# Patient Record
Sex: Female | Born: 1955
Health system: Southern US, Community
[De-identification: ages and names within clinical notes are randomized; demographics above are authoritative.]

## PROBLEM LIST (undated history)

## (undated) DIAGNOSIS — M1990 Unspecified osteoarthritis, unspecified site: Secondary | ICD-10-CM

## (undated) DIAGNOSIS — K219 Gastro-esophageal reflux disease without esophagitis: Secondary | ICD-10-CM

## (undated) DIAGNOSIS — F32A Depression, unspecified: Secondary | ICD-10-CM

## (undated) DIAGNOSIS — M779 Enthesopathy, unspecified: Secondary | ICD-10-CM

## (undated) DIAGNOSIS — E538 Deficiency of other specified B group vitamins: Secondary | ICD-10-CM

## (undated) DIAGNOSIS — F329 Major depressive disorder, single episode, unspecified: Secondary | ICD-10-CM

## (undated) DIAGNOSIS — E7800 Pure hypercholesterolemia, unspecified: Secondary | ICD-10-CM

## (undated) HISTORY — PX: CHOLECYSTECTOMY: SHX55

## (undated) HISTORY — DX: Enthesopathy, unspecified: M77.9

## (undated) HISTORY — PX: DILATION AND CURETTAGE OF UTERUS: SHX78

## (undated) HISTORY — DX: Deficiency of other specified B group vitamins: E53.8

## (undated) HISTORY — DX: Unspecified osteoarthritis, unspecified site: M19.90

## (undated) HISTORY — PX: ROTATOR CUFF REPAIR: SHX139

---

## 1998-01-04 ENCOUNTER — Other Ambulatory Visit: Admission: RE | Admit: 1998-01-04 | Discharge: 1998-01-04 | Payer: Self-pay | Admitting: *Deleted

## 1998-04-15 ENCOUNTER — Emergency Department (HOSPITAL_COMMUNITY): Admission: EM | Admit: 1998-04-15 | Discharge: 1998-04-15 | Payer: Self-pay | Admitting: Emergency Medicine

## 1998-08-05 ENCOUNTER — Other Ambulatory Visit: Admission: RE | Admit: 1998-08-05 | Discharge: 1998-08-05 | Payer: Self-pay | Admitting: *Deleted

## 1999-01-04 ENCOUNTER — Other Ambulatory Visit: Admission: RE | Admit: 1999-01-04 | Discharge: 1999-01-04 | Payer: Self-pay | Admitting: *Deleted

## 1999-01-27 ENCOUNTER — Other Ambulatory Visit: Admission: RE | Admit: 1999-01-27 | Discharge: 1999-01-27 | Payer: Self-pay | Admitting: *Deleted

## 1999-01-27 ENCOUNTER — Encounter (INDEPENDENT_AMBULATORY_CARE_PROVIDER_SITE_OTHER): Payer: Self-pay | Admitting: Specialist

## 2000-01-09 ENCOUNTER — Other Ambulatory Visit: Admission: RE | Admit: 2000-01-09 | Discharge: 2000-01-09 | Payer: Self-pay | Admitting: *Deleted

## 2000-03-18 ENCOUNTER — Other Ambulatory Visit: Admission: RE | Admit: 2000-03-18 | Discharge: 2000-03-18 | Payer: Self-pay | Admitting: *Deleted

## 2001-02-05 ENCOUNTER — Encounter: Admission: RE | Admit: 2001-02-05 | Discharge: 2001-04-09 | Payer: Self-pay | Admitting: Specialist

## 2001-04-07 ENCOUNTER — Other Ambulatory Visit: Admission: RE | Admit: 2001-04-07 | Discharge: 2001-04-07 | Payer: Self-pay | Admitting: *Deleted

## 2001-08-25 ENCOUNTER — Encounter: Admission: RE | Admit: 2001-08-25 | Discharge: 2001-11-23 | Payer: Self-pay | Admitting: *Deleted

## 2001-08-27 ENCOUNTER — Encounter: Payer: Self-pay | Admitting: Specialist

## 2001-08-27 ENCOUNTER — Ambulatory Visit (HOSPITAL_COMMUNITY)
Admission: RE | Admit: 2001-08-27 | Discharge: 2001-08-27 | Payer: Self-pay | Admitting: Physical Medicine & Rehabilitation

## 2001-11-24 ENCOUNTER — Encounter: Admission: RE | Admit: 2001-11-24 | Discharge: 2002-01-19 | Payer: Self-pay | Admitting: Specialist

## 2002-09-21 ENCOUNTER — Encounter: Admission: RE | Admit: 2002-09-21 | Discharge: 2002-10-20 | Payer: Self-pay | Admitting: *Deleted

## 2003-07-10 ENCOUNTER — Emergency Department (HOSPITAL_COMMUNITY): Admission: EM | Admit: 2003-07-10 | Discharge: 2003-07-10 | Payer: Self-pay | Admitting: Emergency Medicine

## 2003-07-26 ENCOUNTER — Ambulatory Visit (HOSPITAL_COMMUNITY): Admission: RE | Admit: 2003-07-26 | Discharge: 2003-07-27 | Payer: Self-pay | Admitting: General Surgery

## 2003-07-26 ENCOUNTER — Encounter (INDEPENDENT_AMBULATORY_CARE_PROVIDER_SITE_OTHER): Payer: Self-pay | Admitting: *Deleted

## 2005-05-07 ENCOUNTER — Other Ambulatory Visit: Admission: RE | Admit: 2005-05-07 | Discharge: 2005-05-07 | Payer: Self-pay | Admitting: Family Medicine

## 2007-01-10 ENCOUNTER — Encounter: Admission: RE | Admit: 2007-01-10 | Discharge: 2007-01-10 | Payer: Self-pay | Admitting: Internal Medicine

## 2008-09-22 ENCOUNTER — Encounter: Admission: RE | Admit: 2008-09-22 | Discharge: 2008-09-22 | Payer: Self-pay | Admitting: Gastroenterology

## 2010-07-09 ENCOUNTER — Encounter: Payer: Self-pay | Admitting: Gastroenterology

## 2010-10-17 ENCOUNTER — Other Ambulatory Visit: Payer: Self-pay | Admitting: Obstetrics and Gynecology

## 2010-11-03 NOTE — H&P (Signed)
Natalie Chen NO.:  [id number]   MEDICAL RECORD NO.:  [id number]                   PATIENT TYPE:  EMS   LOCATION:  MAJO                                 FACILITY:  [facility]   PHYSICIAN:  Adolph Pollack, M.D.            DATE OF BIRTH:  June 12, 1956   DATE OF ADMISSION:  07/10/2003  DATE OF DISCHARGE:                                HISTORY & PHYSICAL   HISTORY AND PHYSICAL/EMERGENCY DEPARTMENT VISIT:   REASON FOR VISIT:  Right upper quadrant pain.   HISTORY OF PRESENT ILLNESS:  Natalie Chen is a 55 year old female in her  normal state of health.  She had a cheeseburger and [place] fries last night  and at 1 o'clock in the morning awoke with severe pressure-type epigastric  pain radiating around to the back.  No nausea or vomiting, no fever or  chills.  She lives close to [facility] so she went there  and was evaluated.  While she was there she had a cardiac workup, and her  cardiac enzymes were negative, and an EKG was negative for acute ischemic  changes.  Amylase was normal.  White count was slightly elevated at 11,800.  They went ahead and performed an abdominal ultrasound on her.  What it  demonstrated was a contracted gallbladder with multiple stones, question  cholecystitis.  She felt better and was discharged from there.  On the ride  home she got a little bit of return of the pain, took some Darvocet and  decided since her daughter was over at [facility] in rehabilitation to  come over here to be evaluated.  She was seen by Dr. Molly Maduro __________ , and  we were asked to see her.  Upon my arrival she was completely pain-free, has  no nausea, feels much better.  She has not received any pain medication.  She has not had anything like this before.   PAST MEDICAL HISTORY:  1. Depression.  2. Ovarian cyst.  3. Shoulder pain.   PREVIOUS OPERATIONS:  1. Cesarean section x3.  2. Laparoscopic ovarian cystectomy.   MEDICAL ALLERGIES:  None.   MEDICATIONS:  Effexor.  Also takes some herbal medications.   SOCIAL HISTORY:  She smokes about a pack of cigarettes a day.  She works for  [employer].  Occasional alcohol use.   REVIEW OF SYSTEMS:  CARDIOVASCULAR:  No known hypertension or heart disease.  PULMONARY:  She has had bronchitis in the past but denies COPD, pneumonia,  or asthma.  GASTROINTESTINAL:  No peptic ulcer disease, hepatitis,  diverticulitis.  ENDOCRINE:  No diabetes, hypercholesterolemia.  NEUROLOGIC:  No strokes or seizures.  HEMATOLOGIC:  No bleeding disorders or blood clots.   PHYSICAL EXAM:  GENERAL:  Mildly obese female in no acute distress.  She is  very pleasant, cooperative.  Sitting up in the bed.  VITAL SIGNS:  She is afebrile with a blood pressure on arrival  of 124/46 and  a pulse of 90.  HEENT:  Eyes, extraocular motions intact.  No icterus.  SKIN:  No jaundice.  LUNGS:  Breath sounds equal and clear.  Respirations not labored.  CARDIOVASCULAR:  Regular rate and rhythm.  No murmur heard.  No lower  extremity edema.  No JVD.  ABDOMEN:  Soft, nontender, nondistended.  Specifically, there is no right  upper quadrant tenderness or guarding.  No palpable masses or organomegaly.  She has a well-healed small subumbilical scar and a lower midline scar  present.  MUSCULOSKELETAL:  No CVA tenderness.  No calf swelling.   LABORATORY DATA:  From [facility]:  Liver function tests are all within  normal limits.  White cell count 11,800 at [facility].  [facility]  records reviewed.   IMPRESSION:  Acute biliary colic.  No signs of acute cholecystitis at this  time.  She seems to have gotten over it and feels much better now.   PLAN:  I discussed with her elective laparoscopic cholecystectomy some time  in the near future.  I went over the procedure and the risks including but  not limited to bleeding, infection, common bile duct injury, hepatic injury,   intestinal injury, risks of anesthesia, and postoperative diarrhea.  She  seems to understand all this and would be agreeable to proceeding some time  in the future.  I also discussed with her a strict low-fat/bland diet.  I  told her we would call her some time next week and try to arrange an  elective date for her surgery.  I did tell her if the pain returned and she  has fever and nausea that she should give [place] a call sooner.                                                Adolph Pollack, M.D.    Kari Baars  D:  07/10/2003  T:  07/10/2003  Job:  [id number]

## 2010-11-03 NOTE — Op Note (Signed)
NAME:  Natalie Chen, Natalie Chen                     ACCOUNT NO.:  [id number]   MEDICAL RECORD NO.:  [id number]                   PATIENT TYPE:  OIB   LOCATION:  6706                                 FACILITY:  [facility]   PHYSICIAN:  Adolph Pollack, M.D.            DATE OF BIRTH:  1956-05-24   DATE OF PROCEDURE:  07/26/2003  DATE OF DISCHARGE:                                 OPERATIVE REPORT   PREOPERATIVE DIAGNOSES:  Intermittent cholelithiasis.   POSTOPERATIVE DIAGNOSES:  Intermittent cholelithiasis.   PROCEDURE:  Laparoscopic cholecystectomy with intraoperative cholangiogram.   SURGEON:  Adolph Pollack, M.D.   ANESTHESIA:  General.   INDICATIONS FOR PROCEDURE:  Natalie Chen is a 55 year old female who had an  attack of biliary colic after eating a cheeseburger and [place] fries.  Ultrasound demonstrated cholelithiasis.  Amylase was normal.  She now  presents for elective cholecystectomy. The procedure and the risks were  discussed with her preoperatively.   TECHNIQUE:  She was seen in the holding area and then brought to the  operating room, placed supine on the operating table, general anesthetic was  administered. Her abdominal wall was sterilely prepped and draped.  Local  anesthetic was infiltrated in the subumbilical region and a previous small  subumbilical incision was reincised sharply through the skin and  subcutaneous tissue until the midline fascia was identified. A small  incision was made in the midline fascia and the peritoneal cavity was  entered bluntly and under direct vision.  A pursestring suture of #0 Vicryl  was placed around the fascial edges.  A Hasson trocar was introduced into  the peritoneal cavity and a pneumoperitoneum was created by insufflation of  CO2 gas.  Next, the laparoscope was introduced.  She was noted to have a  small amount of free fluid in the right pelvic area slightly blood tinged.  She was then placed in the reverse Trendelenburg  position with the right  side tilted slightly up.  Under direct vision, an 11 mm trocar was placed  through an epigastric incision into the peritoneal cavity and then two 5 mm  trocars were placed into the peritoneal cavity through right mid abdominal  incisions.  The fundus of the gallbladder was slightly discolored and pale  red, grasped and retracted toward the right shoulder. The infundibulum was  grasped and mobilized using select cautery and blunt dissection.  I then  isolated the cystic duct and created a window around it.  I placed a clip  above the cystid cut gallbladder junction.  I made an incision into the cyst  at the cystic duct gallbladder junction and inserted the cholangiocatheter  into the cystic duct. A cholangiogram was performed.   Under real-time fluoroscopy, dilute contrast material was injected into the  cystic duct which was of moderate length. The common bile duct and right and  left hepatic ducts filled promptly and the common bile duct drained contrast  promptly to  the duodenum. There is no obvious evidence of obstruction but  final report is pending the radiologist's interpretation.   The cholangiocatheter was removed, the cystic duct was clipped three times  staying inside and then divided.  The anterior and posterior branch of the  cystic artery were identified, clipped, and divided.  The gallbladder was  dissected free from the liver bed using electrocautery and placed in an  Endopouch bag. Following this, the superior hepatic area was copiously  irrigated with saline solution. The solution was evacuated. There was no  bleeding or bile leak noted. The gallbladder was then removed through the  subumbilical port and the subumbilical fascial incision was closed with by  tightening up and tying down the pursestring suture under laparoscopic  vision. The remaining trocars were removed and a pneumoperitoneum was  released.   The skin incisions were then closed  with 4-0 Monocryl subcuticular stitches.  Steri-Strips and sterile dressings were applied. She tolerated the procedure  well without any apparent complications.  She subsequently was taken to the  recovery room in satisfactory condition.                                               Adolph Pollack, M.D.    Kari Baars  D:  07/26/2003  T:  07/27/2003  Job:  [id number]

## 2012-01-09 ENCOUNTER — Other Ambulatory Visit: Payer: Self-pay | Admitting: Obstetrics and Gynecology

## 2012-01-09 DIAGNOSIS — N644 Mastodynia: Secondary | ICD-10-CM

## 2012-01-14 ENCOUNTER — Ambulatory Visit
Admission: RE | Admit: 2012-01-14 | Discharge: 2012-01-14 | Disposition: A | Discharge status: Home / Self Care | Payer: BC Managed Care – PPO | Source: Ambulatory Visit | Attending: Obstetrics and Gynecology | Admitting: Obstetrics and Gynecology

## 2012-01-14 DIAGNOSIS — N644 Mastodynia: Secondary | ICD-10-CM

## 2012-03-25 ENCOUNTER — Emergency Department (HOSPITAL_BASED_OUTPATIENT_CLINIC_OR_DEPARTMENT_OTHER)
Admission: EM | Admit: 2012-03-25 | Discharge: 2012-03-25 | Disposition: A | Discharge status: Home / Self Care | Payer: BC Managed Care – PPO | Attending: Emergency Medicine | Admitting: Emergency Medicine

## 2012-03-25 ENCOUNTER — Encounter (HOSPITAL_BASED_OUTPATIENT_CLINIC_OR_DEPARTMENT_OTHER): Payer: Self-pay | Admitting: Emergency Medicine

## 2012-03-25 ENCOUNTER — Emergency Department (HOSPITAL_BASED_OUTPATIENT_CLINIC_OR_DEPARTMENT_OTHER): Payer: BC Managed Care – PPO

## 2012-03-25 DIAGNOSIS — R109 Unspecified abdominal pain: Secondary | ICD-10-CM | POA: Insufficient documentation

## 2012-03-25 DIAGNOSIS — R112 Nausea with vomiting, unspecified: Secondary | ICD-10-CM | POA: Insufficient documentation

## 2012-03-25 DIAGNOSIS — F17200 Nicotine dependence, unspecified, uncomplicated: Secondary | ICD-10-CM | POA: Insufficient documentation

## 2012-03-25 DIAGNOSIS — R10816 Epigastric abdominal tenderness: Secondary | ICD-10-CM | POA: Insufficient documentation

## 2012-03-25 DIAGNOSIS — R197 Diarrhea, unspecified: Secondary | ICD-10-CM | POA: Insufficient documentation

## 2012-03-25 DIAGNOSIS — Z9089 Acquired absence of other organs: Secondary | ICD-10-CM | POA: Insufficient documentation

## 2012-03-25 HISTORY — DX: Major depressive disorder, single episode, unspecified: F32.9

## 2012-03-25 HISTORY — DX: Depression, unspecified: F32.A

## 2012-03-25 HISTORY — DX: Pure hypercholesterolemia, unspecified: E78.00

## 2012-03-25 HISTORY — DX: Gastro-esophageal reflux disease without esophagitis: K21.9

## 2012-03-25 LAB — URINALYSIS, ROUTINE W REFLEX MICROSCOPIC
Leukocytes, UA: NEGATIVE
Nitrite: NEGATIVE
Specific Gravity, Urine: 1.028 (ref 1.005–1.030)
Urobilinogen, UA: 1 mg/dL (ref 0.0–1.0)
pH: 5.5 (ref 5.0–8.0)

## 2012-03-25 LAB — CBC WITH DIFFERENTIAL/PLATELET
Basophils Absolute: 0 10*3/uL (ref 0.0–0.1)
HCT: 28.6 % — ABNORMAL LOW (ref 36.0–46.0)
Hemoglobin: 13.1 g/dL (ref 12.0–15.0)
Hemoglobin: 9.5 g/dL — ABNORMAL LOW (ref 12.0–15.0)
Lymphocytes Relative: 15 % (ref 12–46)
Lymphocytes Relative: 17 % (ref 12–46)
Lymphs Abs: 1.1 10*3/uL (ref 0.7–4.0)
MCH: 30 pg (ref 26.0–34.0)
Monocytes Absolute: 0.4 10*3/uL (ref 0.1–1.0)
Monocytes Relative: 10 % (ref 3–12)
Neutro Abs: 3.7 10*3/uL (ref 1.7–7.7)
Neutro Abs: 5.4 10*3/uL (ref 1.7–7.7)
Neutrophils Relative %: 74 % (ref 43–77)
Neutrophils Relative %: 75 % (ref 43–77)
Platelets: 197 10*3/uL (ref 150–400)
RBC: 4.36 MIL/uL (ref 3.87–5.11)
RDW: 13.9 % (ref 11.5–15.5)
WBC: 5 10*3/uL (ref 4.0–10.5)
WBC: 7.3 10*3/uL (ref 4.0–10.5)

## 2012-03-25 LAB — COMPREHENSIVE METABOLIC PANEL
ALT: 114 U/L — ABNORMAL HIGH (ref 0–35)
Alkaline Phosphatase: 76 U/L (ref 39–117)
BUN: 12 mg/dL (ref 6–23)
Chloride: 104 mEq/L (ref 96–112)
GFR calc Af Amer: >90 mL/min (ref >90)
Glucose, Bld: 152 mg/dL — ABNORMAL HIGH (ref 70–99)
Potassium: 3.1 mEq/L — ABNORMAL LOW (ref 3.5–5.1)
Sodium: 138 mEq/L (ref 135–145)
Total Bilirubin: 0.6 mg/dL (ref 0.3–1.2)
Total Protein: 6.1 g/dL (ref 6.0–8.3)

## 2012-03-25 LAB — URINE MICROSCOPIC-ADD ON

## 2012-03-25 LAB — LIPASE, BLOOD: Lipase: 13 U/L (ref 11–59)

## 2012-03-25 MED ORDER — GI COCKTAIL ~~LOC~~
30.0000 mL | Freq: Once | ORAL | Status: AC
  Administered 2012-03-25: 30 mL via ORAL
  Filled 2012-03-25: qty 30

## 2012-03-25 MED ORDER — HYDROMORPHONE HCL PF 1 MG/ML IJ SOLN
1.0000 mg | Freq: Once | INTRAMUSCULAR | Status: AC
  Administered 2012-03-25: 1 mg via INTRAVENOUS
  Filled 2012-03-25: qty 1

## 2012-03-25 MED ORDER — IOHEXOL 300 MG/ML  SOLN
100.0000 mL | Freq: Once | INTRAMUSCULAR | Status: AC | PRN
  Administered 2012-03-25: 100 mL via INTRAVENOUS

## 2012-03-25 MED ORDER — SODIUM CHLORIDE 0.9 % IV SOLN
INTRAVENOUS | Status: DC
  Administered 2012-03-25: 01:00:00 via INTRAVENOUS

## 2012-03-25 MED ORDER — IOHEXOL 300 MG/ML  SOLN
20.0000 mL | Freq: Once | INTRAMUSCULAR | Status: AC | PRN
  Administered 2012-03-25: 20 mL via ORAL

## 2012-03-25 MED ORDER — ONDANSETRON HCL 4 MG/2ML IJ SOLN
4.0000 mg | Freq: Once | INTRAMUSCULAR | Status: AC
  Administered 2012-03-25: 4 mg via INTRAVENOUS
  Filled 2012-03-25: qty 2

## 2012-03-25 MED ORDER — ONDANSETRON 8 MG PO TBDP: 8.0000 mg | ORAL_TABLET | Freq: Three times a day (TID) | ORAL | Status: DC | PRN

## 2012-03-25 MED ORDER — OXYCODONE-ACETAMINOPHEN 5-325 MG PO TABS: 1.0000 | ORAL_TABLET | Freq: Four times a day (QID) | ORAL | Status: DC | PRN

## 2012-03-25 NOTE — ED Provider Notes (Addendum)
History   This chart was scribed for Natalie Seamen, MD by Sofie Rower. The patient was seen in room MH07/MH07 and the patient's care was started at 12:25AM.    CSN: [id number]  Arrival date & time 03/25/12  0016   None     Chief Complaint  Patient presents with  . Abdominal Pain    (Consider location/radiation/quality/duration/timing/severity/associated sxs/prior treatment) Patient is a 56 y.o. female presenting with abdominal pain. The history is provided by the patient. No language interpreter was used.  Abdominal Pain The primary symptoms of the illness include abdominal pain, nausea, vomiting and diarrhea. The primary symptoms of the illness do not include fever or dysuria. The current episode started more than 2 days ago. The onset of the illness was sudden. The problem has been gradually worsening.  The abdominal pain began more than 2 days ago. The pain came on suddenly. The abdominal pain has been gradually worsening since its onset. The abdominal pain is located in the epigastric region. The abdominal pain radiates to the back. The severity of the abdominal pain is 9/10. The abdominal pain is relieved by nothing. The abdominal pain is exacerbated by movement.  Nausea began 6 to 7 days ago.  The vomiting began today. Vomiting occurred once. The emesis contains stomach contents.  The diarrhea began today. The diarrhea is watery. The diarrhea occurs once per day.  Symptoms associated with the illness do not include chills.    Natalie Chen is a 56 y.o. female , brought by EMS, who presents to the Emergency Department complaining of  sudden, progressively worsening, abdominal pain located epigastrically, radiating towards the back, onset one week ago, with associated symptoms of nausea, vomiting, and diarrhea. The pt reports she has been experiencing abdominal pain for about a week, however, the symptoms have become severe as of yesterday evening. The pt rates the abdominal pain at  a 9/10 at present. The pt has not been given any medications by EMS, prior to arrival. Modifying factors include certain movements and positions which intensify the abdominal pain.  The pt denies fever or chills associated with the abdominal pain.   The pt is a current everyday smoker (0.5 packs/day), in addition to drinking alcohol.    Past Medical History  Diagnosis Date  . High cholesterol   . GERD (gastroesophageal reflux disease)   . Depression     Past Surgical History  Procedure Date  . Cholecystectomy   . Cesarean section   . Rotator cuff repair     No family history on file.  History  Substance Use Topics  . Smoking status: Current Every Day Smoker -- 0.5 packs/day  . Smokeless tobacco: Never Used  . Alcohol Use: Yes    OB History    Grav Para Term Preterm Abortions TAB SAB Ect Mult Living                  Review of Systems  Constitutional: Negative for fever and chills.  Gastrointestinal: Positive for nausea, vomiting, abdominal pain and diarrhea.  Genitourinary: Negative for dysuria.  All other systems reviewed and are negative.    Allergies  Vicodin and Ultram  Home Medications   Current Outpatient Rx  Name Route Sig Dispense Refill  . ASPIRIN 81 MG PO TABS Oral Take 81 mg by mouth daily.    Marland Kitchen NIACIN 500 MG PO TABS Oral Take 500 mg by mouth daily with breakfast.    . PANTOPRAZOLE SODIUM 40 MG PO TBEC  Oral Take 40 mg by mouth daily.    . VENLAFAXINE HCL ER 150 MG PO CP24 Oral Take 150 mg by mouth daily.      BP 113/54  Pulse 67  Temp 98.2 F (36.8 C) (Oral)  Resp 17  Ht 5\' 4"  (1.626 m)  Wt 200 lb (90.719 kg)  BMI 34.33 kg/m2  SpO2 97%  Physical Exam  Nursing note and vitals reviewed. Constitutional: She is oriented to person, place, and time. She appears well-developed and well-nourished.  HENT:  Head: Atraumatic.  Nose: Nose normal.  Eyes: Conjunctivae normal and EOM are normal. Pupils are equal, round, and reactive to light.    Neck: Normal range of motion. Neck supple.  Cardiovascular: Normal rate, regular rhythm and normal heart sounds.   Pulmonary/Chest: Effort normal and breath sounds normal. No respiratory distress.  Abdominal: Soft. There is tenderness.       Abdominal tenderness located epigastrically.   Musculoskeletal: Normal range of motion.  Neurological: She is alert and oriented to person, place, and time.  Skin: Skin is warm and dry.  Psychiatric: She has a normal mood and affect. Her behavior is normal.    ED Course  Procedures (including critical care time)  DIAGNOSTIC STUDIES: Oxygen Saturation is 97% on room air, normal by my interpretation.     12:28AM- Treatment plan discussed with patient. Pt agrees to treatment.     MDM   Nursing notes and vitals signs, including pulse oximetry, reviewed.  Summary of this visit's results, reviewed by myself:  Labs:  Results for orders placed during the hospital encounter of 03/25/12  CBC WITH DIFFERENTIAL      Component Value Range   WBC 5.0  4.0 - 10.5 K/uL   RBC 3.18 (*) 3.87 - 5.11 MIL/uL   Hemoglobin 9.5 (*) 12.0 - 15.0 g/dL   HCT 16.1 (*) 09.6 - 04.5 %   MCV 89.9  78.0 - 100.0 fL   MCH 29.9  26.0 - 34.0 pg   MCHC 33.2  30.0 - 36.0 g/dL   RDW 40.9  81.1 - 91.4 %   Platelets 148 (*) 150 - 400 K/uL   Neutrophils Relative 74  43 - 77 %   Neutro Abs 3.7  1.7 - 7.7 K/uL   Lymphocytes Relative 17  12 - 46 %   Lymphs Abs 0.9  0.7 - 4.0 K/uL   Monocytes Relative 7  3 - 12 %   Monocytes Absolute 0.4  0.1 - 1.0 K/uL   Eosinophils Relative 2  0 - 5 %   Eosinophils Absolute 0.1  0.0 - 0.7 K/uL   Basophils Relative 0  0 - 1 %   Basophils Absolute 0.0  0.0 - 0.1 K/uL  URINALYSIS, ROUTINE W REFLEX MICROSCOPIC      Component Value Range   Color, Urine AMBER (*) YELLOW   APPearance CLEAR  CLEAR   Specific Gravity, Urine 1.028  1.005 - 1.030   pH 5.5  5.0 - 8.0   Glucose, UA NEGATIVE  NEGATIVE mg/dL   Hgb urine dipstick TRACE (*) NEGATIVE    Bilirubin Urine SMALL (*) NEGATIVE   Ketones, ur 15 (*) NEGATIVE mg/dL   Protein, ur NEGATIVE  NEGATIVE mg/dL   Urobilinogen, UA 1.0  0.0 - 1.0 mg/dL   Nitrite NEGATIVE  NEGATIVE   Leukocytes, UA NEGATIVE  NEGATIVE  CBC WITH DIFFERENTIAL      Component Value Range   WBC 7.3  4.0 - 10.5 K/uL  RBC 4.36  3.87 - 5.11 MIL/uL   Hemoglobin 13.1  12.0 - 15.0 g/dL   HCT 40.9  81.1 - 91.4 %   MCV 88.8  78.0 - 100.0 fL   MCH 30.0  26.0 - 34.0 pg   MCHC 33.9  30.0 - 36.0 g/dL   RDW 78.2  95.6 - 21.3 %   Platelets 197  150 - 400 K/uL   Neutrophils Relative 75  43 - 77 %   Neutro Abs 5.4  1.7 - 7.7 K/uL   Lymphocytes Relative 15  12 - 46 %   Lymphs Abs 1.1  0.7 - 4.0 K/uL   Monocytes Relative 10  3 - 12 %   Monocytes Absolute 0.7  0.1 - 1.0 K/uL   Eosinophils Relative 1  0 - 5 %   Eosinophils Absolute 0.1  0.0 - 0.7 K/uL   Basophils Relative 0  0 - 1 %   Basophils Absolute 0.0  0.0 - 0.1 K/uL  COMPREHENSIVE METABOLIC PANEL      Component Value Range   Sodium 138  135 - 145 mEq/L   Potassium 3.1 (*) 3.5 - 5.1 mEq/L   Chloride 104  96 - 112 mEq/L   CO2 23  19 - 32 mEq/L   Glucose, Bld 152 (*) 70 - 99 mg/dL   BUN 12  6 - 23 mg/dL   Creatinine, Ser 0.86  0.50 - 1.10 mg/dL   Calcium 8.3 (*) 8.4 - 10.5 mg/dL   Total Protein 6.1  6.0 - 8.3 g/dL   Albumin 3.4 (*) 3.5 - 5.2 g/dL   AST 578 (*) 0 - 37 U/L   ALT 114 (*) 0 - 35 U/L   Alkaline Phosphatase 76  39 - 117 U/L   Total Bilirubin 0.6  0.3 - 1.2 mg/dL   GFR calc non Af Amer >90  >90 mL/min   GFR calc Af Amer >90  >90 mL/min  LIPASE, BLOOD      Component Value Range   Lipase 13  11 - 59 U/L  URINE MICROSCOPIC-ADD ON      Component Value Range   Squamous Epithelial / LPF FEW (*) RARE   WBC, UA 0-2  <3 WBC/hpf   RBC / HPF 0-2  <3 RBC/hpf   Casts GRANULAR CAST (*) NEGATIVE   Urine-Other MUCOUS PRESENT      Imaging Studies: Ct Abdomen Pelvis W Contrast  03/25/2012  *RADIOLOGY REPORT*  Clinical Data: 56 year old female with  epigastric pain and chills. History of cholecystectomy.  CT ABDOMEN AND PELVIS WITH CONTRAST  Technique:  Multidetector CT imaging of the abdomen and pelvis was performed following the standard protocol during bolus administration of intravenous contrast.  Contrast: OMNIPAQUE IOHEXOL 300 MG/ML  SOLN, 20mL OMNIPAQUE IOHEXOL 300 MG/ML  SOLN  Comparison: Upper GI series 09/22/2008.  Findings: Mild dependent atelectasis.  No pericardial or pleural effusion. No acute osseous abnormality identified.  Trace free fluid in the cul-de-sac.  Uterus and adnexa appear unremarkable.  Distal colon appears decompressed and within normal limits.  Diminutive, unremarkable bladder.  More proximal colon within normal limits.  Normal appendix.  Oral contrast has not reached the distal small bowel.  No dilated or abnormal small bowel identified.  Trace contrast in the distal thoracic esophagus.  The stomach and duodenum are within normal limits.  The gallbladder is surgically absent.  Liver, spleen, pancreas and adrenal glands are within normal limits.  Portal venous system within normal limits.  Kidneys  are within normal limits; incidental horizontal axis of the right kidney.  No abdominal free fluid. Major arterial structures are patent with mild atherosclerosis.  IMPRESSION: 1.  Nonspecific trace free fluid in the pelvis.  This could be physiologic if the patient is premenopausal.  Otherwise, it is nonspecific. 2.  No focal inflammatory changes or origin of the free fluid is identified.  The appendix is normal.   Original Report Authenticated By: Harley Hallmark, M.D.    3:27 AM Patient's pain is now a 3/10. She got initial relief with GI cocktail. She was advised of the CT and laboratory findings. At this point differential diagnosis includes gastritis, peptic ulcer disease, gastroenteritis. She is arty on Protonix. We will treat her symptomatically and she will followup with her primary care physician. She was advised that  an upper endoscopy may be indicated for evaluation for gastritis or ulcer.    EKG Interpretation:  Date & Time: 03/25/2012 12:23 AM  Rate: 63  Rhythm: normal sinus rhythm and sinus arrhythmia  QRS Axis: normal  Intervals: normal  ST/T Wave abnormalities: normal  Conduction Disutrbances:none  Narrative Interpretation: Low voltages  Old EKG Reviewed: none available     I personally performed the services described in this documentation, which was scribed in my presence.  The recorded information has been reviewed and considered.    Natalie Seamen, MD 03/25/12 0037  Natalie Seamen, MD 03/25/12 1610

## 2012-03-25 NOTE — ED Notes (Signed)
flulike sx and epigastric pain intermittently x1 week. Nausea but no vomiting.  Much worse today and tonight.

## 2012-03-25 NOTE — ED Notes (Signed)
MD at bedside. 

## 2013-07-02 ENCOUNTER — Other Ambulatory Visit: Payer: Self-pay | Admitting: Obstetrics and Gynecology

## 2016-11-13 ENCOUNTER — Other Ambulatory Visit: Payer: Self-pay | Admitting: Internal Medicine

## 2016-11-13 ENCOUNTER — Ambulatory Visit
Admission: RE | Admit: 2016-11-13 | Discharge: 2016-11-13 | Disposition: A | Discharge status: Home / Self Care | Payer: Self-pay | Source: Ambulatory Visit | Attending: Internal Medicine | Admitting: Internal Medicine

## 2016-11-13 DIAGNOSIS — M545 Low back pain, unspecified: Secondary | ICD-10-CM

## 2016-11-15 ENCOUNTER — Encounter: Payer: Self-pay | Admitting: Neurology

## 2016-11-16 ENCOUNTER — Other Ambulatory Visit: Payer: Self-pay | Admitting: *Deleted

## 2016-11-16 DIAGNOSIS — R29898 Other symptoms and signs involving the musculoskeletal system: Secondary | ICD-10-CM

## 2016-11-27 ENCOUNTER — Encounter: Payer: 59 | Admitting: Neurology

## 2017-01-17 ENCOUNTER — Encounter: Payer: 59 | Admitting: Neurology

## 2017-02-08 ENCOUNTER — Encounter: Payer: Self-pay | Admitting: *Deleted

## 2017-02-11 ENCOUNTER — Ambulatory Visit (INDEPENDENT_AMBULATORY_CARE_PROVIDER_SITE_OTHER): Payer: 59 | Admitting: Neurology

## 2017-02-11 ENCOUNTER — Encounter: Payer: Self-pay | Admitting: Neurology

## 2017-02-11 VITALS — BP 124/76 | HR 74 | Ht 63.0 in | Wt 213.8 lb

## 2017-02-11 DIAGNOSIS — R29898 Other symptoms and signs involving the musculoskeletal system: Secondary | ICD-10-CM

## 2017-02-11 DIAGNOSIS — R200 Anesthesia of skin: Secondary | ICD-10-CM | POA: Diagnosis not present

## 2017-02-11 DIAGNOSIS — W19XXXA Unspecified fall, initial encounter: Secondary | ICD-10-CM

## 2017-02-11 DIAGNOSIS — M5416 Radiculopathy, lumbar region: Secondary | ICD-10-CM

## 2017-02-11 NOTE — Progress Notes (Signed)
[facility]    Provider:  Dr Jaynee Eagles Referring Provider: Seward Carol, MD Primary Care Physician:  Seward Carol, MD  CC:  Left thigh numbness, right leg weakness from hip to knee, falls.  HPI:  Natalie Chen is a 61 y.o. female here as a referral from Dr. Delfina Redwood for leg pain and falls, numbness. She has a past medical history of bicep tendinitis, localized osteoarthritis of the left shoulder, impingement syndrome of the left shoulder, arthroscopic rotator cuff repair, falls, numbness. She has been seeing a chiropractor for several years due to hip and neck pain. Last October she started noticing numbness in her left thigh and then from right hip to right knee pain. Several months ago she fell several times, she just "went down" due to uneven ground but she had no warning and unsure why she fell, [place], then she fell for a 3rd time the right leg just became weak. No numbness or birning or tingling or coldness in the feet. She is taking B12 injections monthly. When she goes up and down stairs, her right leg is weaker. She has had back pain mostly on the right with radiation to the hip and weak right leg.   Reviewed notes, labs and imaging from outside physicians, which showed:  Reviewed notes from [facility] orthopedics. The patient is a 61 year old female who presents for follow-up for transition into clear. She reports low back pain on the right side and bilateral leg pain including pain, burning and tingling which began months previous without any known injury. Severity of the symptoms is mild.  Review of Systems: Patient complains of symptoms per HPI as well as the following symptoms: no CP, no SOB. Pertinent negatives and positives per HPI. All others negative.   Social History   Social History  . Marital status: Divorced    Spouse name: N/A  . Number of children: 3  . Years of education: 15   Occupational History    . Not on file.   Social History Main Topics  . Smoking status: Former Smoker    Packs/day: 3.00    Quit date: 01/11/2016  . Smokeless tobacco: Never Used  . Alcohol use Yes  . Drug use: No  . Sexual activity: Not on file   Other Topics Concern  . Not on file   Social History Narrative   LPt lives alone, divorced.  Works [employer].  Has 3 children.     Caffeine use: Coffee daily (2 cups)   Right handed    Family History  Problem Relation Age of Onset  . Heart disease Father   . Arthritis/Rheumatoid Sister   . Cancer Maternal Grandmother     Past Medical History:  Diagnosis Date  . B12 deficiency    On B12 injections Started May 2018  . Depression   . GERD (gastroesophageal reflux disease)   . High cholesterol   . Osteoarthritis    L shoulder  . Tendinitis    left  biceps    Past Surgical History:  Procedure Laterality Date  . CESAREAN SECTION     x3  . CHOLECYSTECTOMY    . DILATION AND CURETTAGE OF UTERUS    . ROTATOR CUFF REPAIR     right- 2003, left- 02/2016    Current Outpatient Prescriptions  Medication Sig Dispense Refill  . Brompheniramine-Pseudoeph (BROMALINE PO) Take 1 tablet by mouth daily.    . cyanocobalamin (,VITAMIN B-12,) 1000 MCG/ML injection  Inject 1,000 mcg into the muscle.    . Melatonin 1 MG TABS Take 3 mg by mouth at bedtime.     Marland Kitchen venlafaxine XR (EFFEXOR-XR) 150 MG 24 hr capsule Take 150 mg by mouth daily.     No current facility-administered medications for this visit.     Allergies as of 02/11/2017 - Review Complete 02/11/2017  Allergen Reaction Noted  . Macrobid [nitrofurantoin macrocrystal]  02/08/2017  . Vicodin [hydrocodone-acetaminophen] Itching 03/25/2012  . Ultram [tramadol] Anxiety 03/25/2012    Vitals: BP 124/76 (BP Location: Right Arm, Patient Position: Sitting, Cuff Size: Large)   Pulse 74   Ht 5\' 3"  (1.6 m)   Wt 213 lb 12.8 oz (97 kg)   BMI 37.87 kg/m  Last Weight:  Wt Readings from Last 1 Encounters:  02/11/17  213 lb 12.8 oz (97 kg)   Last Height:   Ht Readings from Last 1 Encounters:  02/11/17 5\' 3"  (1.6 m)    Physical exam: Exam: Gen: NAD, conversant, well nourised, obese, well groomed                     CV: RRR, no MRG. No Carotid Bruits. No peripheral edema, warm, nontender Eyes: Conjunctivae clear without exudates or hemorrhage  Neuro: Detailed Neurologic Exam  Speech:    Speech is normal; fluent and spontaneous with normal comprehension.  Cognition:    The patient is oriented to person, place, and time;     recent and remote memory intact;     language fluent;     normal attention, concentration,     fund of knowledge Cranial Nerves:    The pupils are equal, round, and reactive to light. The fundi are normal and spontaneous venous pulsations are present. Visual fields are full to finger confrontation. Extraocular movements are intact. Trigeminal sensation is intact and the muscles of mastication are normal. The face is symmetric. The palate elevates in the midline. Hearing intact. Voice is normal. Shoulder shrug is normal. The tongue has normal motion without fasciculations.   Coordination:    Normal finger to nose and heel to shin. Normal rapid alternating movements.   Gait:    Heel-toe and tandem gait are normal.   Motor Observation:    No asymmetry, no atrophy, and no involuntary movements noted. Tone:    Normal muscle tone.    Posture:    Posture is normal. normal erect    Strength: Right LE proximal weakness otherwise strength is V/V in the upper and lower limbs.      Sensation: intact to LT     Reflex Exam:  DTR's: depressed right patellar, absent AJs. Otherwise  Deep tendon reflexes in the upper and lower extremities are brisk bilaterally.   Toes:    The toes are downgoing bilaterally.   Clonus:    Clonus is absent.      Assessment/Plan:  Patient with right anterolateral thigh numbness and right proximal weakness and pain. Need to rule out Radiculopathy  but may be left meralgia paresthetica and right leg osteoarthritis or myalgia. Has tried conservative measures for >3 months with chiropractic care, nsaids, heat, stretching. She has had multiple falls.  As far as diagnostic testing: MRI lumbar spine, Physical Therapy, Caren Beasley [phone] for weight loss, emg/ncs  Meralgia [place] vs Lumbar radiculopathy  Orders Placed This Encounter  Procedures  . MR LUMBAR SPINE WO CONTRAST  . Ambulatory referral to Physical Therapy  . NCV with EMG(electromyography)   Cc: Dr. Delfina Redwood  Sarina Ill, MD  [facility] [address] [place], [place] [address]  Phone [phone] Fax [phone]

## 2017-02-11 NOTE — Patient Instructions (Signed)
Remember to drink plenty of fluid, eat healthy meals and do not skip any meals. Try to eat protein with a every meal and eat a healthy snack such as fruit or nuts in between meals. Try to keep a regular sleep-wake schedule and try to exercise daily, particularly in the form of walking, 20-30 minutes a day, if you can.   As far as diagnostic testing: MRI lumbar spine, Physical Therapy, Caren Beasley [phone], emg/ncs  Meralgia [place] vs Lumbar radiculopathy (pinching of nerves)  I would like to see you back for emg/ncs, sooner if we need to. Please call us with any interim questions, concerns, problems, updates or refill requests.   Please also call us for any test results so we can go over those with you on the phone.  My clinical assistant and will answer any of your questions and relay your messages to me and also relay most of my messages to you.   Our phone number is [phone]. We also have an after hours call service for urgent matters and there is a physician on-call for urgent questions. For any emergencies you know to call 911 or go to the nearest emergency room   Sciatica Sciatica is pain, numbness, weakness, or tingling along your sciatic nerve. The sciatic nerve starts in the lower back and goes down the back of each leg. Sciatica happens when this nerve is pinched or has pressure put on it. Sciatica usually goes away on its own or with treatment. Sometimes, sciatica may keep coming back (recur). Follow these instructions at home: Medicines  Take over-the-counter and prescription medicines only as told by your doctor.  Do not drive or use heavy machinery while taking prescription pain medicine. Managing pain  If directed, put ice on the affected area. ? Put ice in a plastic bag. ? Place a towel between your skin and the bag. ? Leave the ice on for 20 minutes, 2-3 times a day.  After icing, apply heat to the affected area before you exercise or as often as told by your  doctor. Use the heat source that your doctor tells you to use, such as a moist heat pack or a heating pad. ? Place a towel between your skin and the heat source. ? Leave the heat on for 20-30 minutes. ? Remove the heat if your skin turns bright red. This is especially important if you are unable to feel pain, heat, or cold. You may have a greater risk of getting burned. Activity  Return to your normal activities as told by your doctor. Ask your doctor what activities are safe for you. ? Avoid activities that make your sciatica worse.  Take short rests during the day. Rest in a lying or standing position. This is usually better than sitting to rest. ? When you rest for a long time, do some physical activity or stretching between periods of rest. ? Avoid sitting for a long time without moving. Get up and move around at least one time each hour.  Exercise and stretch regularly, as told by your doctor.  Do not lift anything that is heavier than 10 lb (4.5 kg) while you have symptoms of sciatica. ? Avoid lifting heavy things even when you do not have symptoms. ? Avoid lifting heavy things over and over.  When you lift objects, always lift in a way that is safe for your body. To do this, you should: ? Bend your knees. ? Keep the object close to your body. ?  Avoid twisting. General instructions  Use good posture. ? Avoid leaning forward when you are sitting. ? Avoid hunching over when you are standing.  Stay at a healthy weight.  Wear comfortable shoes that support your feet. Avoid wearing high heels.  Avoid sleeping on a mattress that is too soft or too hard. You might have less pain if you sleep on a mattress that is firm enough to support your back.  Keep all follow-up visits as told by your doctor. This is important. Contact a doctor if:  You have pain that: ? Wakes you up when you are sleeping. ? Gets worse when you lie down. ? Is worse than the pain you have had in the  past. ? Lasts longer than 4 weeks.  You lose weight for without trying. Get help right away if:  You cannot control when you pee (urinate) or poop (have a bowel movement).  You have weakness in any of these areas and it gets worse. ? Lower back. ? Lower belly (pelvis). ? Butt (buttocks). ? Legs.  You have redness or swelling of your back.  You have a burning feeling when you pee. This information is not intended to replace advice given to you by your health care provider. Make sure you discuss any questions you have with your health care provider. Document Released: 03/13/2008 Document Revised: 11/10/2015 Document Reviewed: 02/11/2015 Elsevier Interactive Patient Education  [employer].

## 2017-02-20 ENCOUNTER — Ambulatory Visit (INDEPENDENT_AMBULATORY_CARE_PROVIDER_SITE_OTHER): Payer: 59

## 2017-02-20 DIAGNOSIS — R200 Anesthesia of skin: Secondary | ICD-10-CM

## 2017-02-20 DIAGNOSIS — M5416 Radiculopathy, lumbar region: Secondary | ICD-10-CM

## 2017-02-20 DIAGNOSIS — R29898 Other symptoms and signs involving the musculoskeletal system: Secondary | ICD-10-CM

## 2017-02-20 DIAGNOSIS — W19XXXA Unspecified fall, initial encounter: Secondary | ICD-10-CM

## 2017-02-21 ENCOUNTER — Encounter: Payer: 59 | Admitting: Neurology

## 2017-02-26 ENCOUNTER — Ambulatory Visit: Payer: 59 | Attending: Neurology | Admitting: Physical Therapy

## 2017-02-26 DIAGNOSIS — M6281 Muscle weakness (generalized): Secondary | ICD-10-CM | POA: Insufficient documentation

## 2017-02-26 DIAGNOSIS — Z9181 History of falling: Secondary | ICD-10-CM | POA: Diagnosis present

## 2017-02-26 DIAGNOSIS — M5416 Radiculopathy, lumbar region: Secondary | ICD-10-CM | POA: Diagnosis present

## 2017-02-26 NOTE — Patient Instructions (Signed)
Hamstring Step 2   Left foot relaxed, knee straight, other leg bent, foot flat. Raise straight leg further upward to maximal range. Hold __30_ seconds. Relax leg completely down. Repeat _3__ times.  Piriformis Stretch   Lying on back, pull right knee toward opposite shoulder. Hold __30__ seconds. Repeat __3__ times.   Bridge   Lie back, legs bent. Inhale, pressing hips up. Keeping ribs in, lengthen lower back. Exhale, rolling down along spine from top. Repeat __15__ times. Do __2__ sessions per day.  ABDUCTION: Side-Lying (Active)   Lie on left side, top leg straight. Raise top leg as far as possible. Use _0__ lbs. Complete _2__ sets of _15__ repetitions.   Strengthening: Straight Leg Raise (Phase 1)   Tighten muscles on front of right thigh, then lift leg _6-8___ inches from surface, keeping knee locked.  Repeat __15__ times per set. Do __2__ sets per session.   Pelvic Tilt: Posterior - Legs Bent (Supine)   Tighten stomach and flatten back by rolling pelvis down. Hold __5-10__ seconds. Relax. Repeat __15__ times per set. Do __2__ sets per session.

## 2017-02-26 NOTE — Therapy (Signed)
[facility] [address]  [place], [place], [address] Phone: [phone]   Fax:  [phone]  Physical Therapy Evaluation  Patient Details  Name: Natalie Chen MRN: [id number] Date of Birth: 11-14-1955 Referring Provider: Dr. Sarina Ill  Encounter Date: 02/26/2017      PT End of Session - 02/26/17 1441    Visit Number 1   Number of Visits 12   Date for PT Re-Evaluation 04/09/17   PT Start Time 0800   PT Stop Time 0848   PT Time Calculation (min) 48 min   Activity Tolerance Patient tolerated treatment well   Behavior During Therapy [facility] for tasks assessed/performed      Past Medical History:  Diagnosis Date  . B12 deficiency    On B12 injections Started May 2018  . Depression   . GERD (gastroesophageal reflux disease)   . High cholesterol   . Osteoarthritis    L shoulder  . Tendinitis    left  biceps    Past Surgical History:  Procedure Laterality Date  . CESAREAN SECTION     x3  . CHOLECYSTECTOMY    . DILATION AND CURETTAGE OF UTERUS    . ROTATOR CUFF REPAIR     right- 2003, left- 02/2016    There were no vitals filed for this visit.       Subjective Assessment - 02/26/17 0758    Subjective Patient reporting numbness of L lateral thigh when standing or lying down. Has R hip pain down to knee when lying down. Symptoms starts approx ~Sept 2017 - Had shoulder surgery, and when she was recovering she noticed pain. Had been falling, had 3 in one day - feels like her leg gave away - started in last 3 months. Has been seeing chiro for a couple years - improvements in back and neck pain in the past. Difficulty with going up stairs with R LE weakness.    How long can you sit comfortably? 45 minutes   How long can you stand comfortably? 3-5 minutes   How long can you walk comfortably? < 1 mile   Diagnostic tests MRI: 1.   At L3-L4 there is disc bulging more to the left and mild facet hypertrophy  causing moderate left foraminal narrowing encroaching on the left L3 nerve root without causing definite nerve root compression. 2.   At L4-L5, there are degenerative changes causing mild bilateral foraminal narrowing. There is no nerve root compression. 3.   At L5-S1, there is disc bulging and right facet hypertrophy causing moderate right foraminal narrowing encroaching the right L5 nerve root without causing definite nerve root compression   Patient Stated Goals improve symptoms   Currently in Pain? Yes   Pain Score 2    Pain Location Leg   Pain Orientation Right;Left   Pain Descriptors / Indicators Numbness   Pain Type Chronic pain   Aggravating Factors  lying or standing   Pain Relieving Factors sitting            [facility] PT Assessment - 02/26/17 0754      Assessment   Medical Diagnosis R leg weakness, L leg numbness, Lumbar Radiculopathy, Leg numbness, Fall   Referring Provider Dr. Sarina Ill   Onset Date/Surgical Date --  02/2016   Next MD Visit 02/28/17   Prior Therapy no     Precautions   Precautions None     Restrictions   Weight Bearing Restrictions No  Balance Screen   Has the patient fallen in the past 6 months Yes   How many times? 5   Has the patient had a decrease in activity level because of a fear of falling?  No   Is the patient reluctant to leave their home because of a fear of falling?  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     Cognition   Overall Cognitive Status Within [employer] [facility] for tasks assessed     Praxair Impaired Detail   Additional Comments reduced sensation of L lateral thigh compared to R      Coordination   Gross Motor Movements are Fluid and Coordinated Yes     Posture/Postural Control   Posture/Postural Control Postural  limitations   Postural Limitations Rounded Shoulders;Forward head     ROM / Strength   AROM / PROM / Strength AROM;Strength     AROM   AROM Assessment Site Lumbar   Lumbar Flexion WNL - knees bent   Lumbar Extension WNL - "twinge in back"   Lumbar - Right Side Bend WNL - "twinge in back"   Lumbar - Left Side Bend WNL - "twinge in back"   Lumbar - Right Rotation WNL   Lumbar - Left Rotation WNL     Strength   Strength Assessment Site Hip;Knee   Right/Left Hip Right;Left   Right Hip Flexion 3+/5   Right Hip Extension 3+/5   Right Hip ABduction 4-/5   Left Hip Flexion 4-/5   Left Hip Extension 4-/5   Left Hip ABduction 4-/5   Right/Left Knee Right;Left   Right Knee Flexion 4/5   Right Knee Extension 4/5   Left Knee Flexion 4+/5   Left Knee Extension 4+/5     Flexibility   Soft Tissue Assessment /Muscle Length yes   Hamstrings B HS - mild tightness     Palpation   Palpation comment TTP along B lumbar paraspinals, R sided glute/piriformis complex            Objective measurements completed on examination: See above findings.          [facility] Adult PT Treatment/Exercise - 02/26/17 0754      Exercises   Exercises Lumbar     Lumbar Exercises: Stretches   Passive Hamstring Stretch 3 reps;30 seconds   Passive Hamstring Stretch Limitations B: supine with strap   Piriformis Stretch 3 reps;30 seconds   Piriformis Stretch Limitations R side only     Lumbar Exercises: Supine   Ab Set 10 reps;5 seconds   Bridge 10 reps   Straight Leg Raise 10 reps   Straight Leg Raises Limitations bilateral     Lumbar Exercises: Sidelying   Hip Abduction 10 reps   Hip Abduction Limitations bilateral                PT Education - 02/26/17 1441    Education provided Yes   Education Details exam findings, POC, HEP   Person(s) Educated Patient   Methods Explanation;Demonstration;Handout   Comprehension Verbalized understanding;Returned demonstration;Need further  instruction          PT Short Term Goals - 02/26/17 1518      PT SHORT TERM GOAL #1   Title patient to be independent with initial HEP  Status New   Target Date 03/19/17           PT Long Term Goals - 02/26/17 1519      PT LONG TERM GOAL #1   Title patient to be independent with advanced HEP   Status New   Target Date 04/09/17     PT LONG TERM GOAL #2   Title patient to improve B LE strength to >/= 4+/5   Status New   Target Date 04/09/17     PT LONG TERM GOAL #3   Title patient to report pain reduction by 50% with lying and standing for prolonged times for greater than 2 weeks   Status New   Target Date 04/09/17     PT LONG TERM GOAL #4   Title patient to report ability to return to walking for exercise for >/= 1 mile without pain/weakness limiting   Status New   Target Date 04/09/17                Plan - 02/26/17 1442    Clinical Impression Statement Patient is a 61 y/o female presenting to [facility] today regarding primary complaints of LE weakness, numbness and a history of falls. Patient with recent MRI completed with degenerative changes noted. Patient questioning results of MRI with PT expressing possibility of degenerative changes and possible impingement with PT further educating patient that MD would review MRI results at EMG/NCS study on Thursday 02/28/17. Patient today with good lumbar AROM, however, does demonstrate proximal hip weakness as well as TTP over R glute/piriformis region. Patient given initial HEP for gentle stretching and strengthening with good carryover. patient to benefit form PT to address functional mobility concerns to allow for improved QOL.    Clinical Presentation Stable   Clinical Decision Making Low   Rehab Potential Good   PT Frequency 2x / week   PT Duration 6 weeks   PT Treatment/Interventions ADLs/Self Care Home Management;Cryotherapy;Electrical Stimulation;Moist Heat;Traction;Ultrasound;Iontophoresis 4mg /ml  Dexamethasone;Neuromuscular re-education;Balance training;Therapeutic exercise;Therapeutic activities;Functional mobility training;Patient/family education;Manual techniques;Passive range of motion;Vasopneumatic Device;Taping;Dry needling   Consulted and Agree with Plan of Care Patient      Patient will benefit from skilled therapeutic intervention in order to improve the following deficits and impairments:  Abnormal gait, Decreased activity tolerance, Decreased mobility, Decreased strength, Difficulty walking, Pain  Visit Diagnosis: History of falling - Plan: PT plan of care cert/re-cert  Radiculopathy, lumbar region - Plan: PT plan of care cert/re-cert  Muscle weakness (generalized) - Plan: PT plan of care cert/re-cert     Problem List There are no active problems to display for this patient.    Lanney Gins, PT, DPT 02/26/17 5:57 PM   [facility] [address]  [place], [place], [address] Phone: [phone]   Fax:  [phone]  Name: Natalie Chen MRN: [id number] Date of Birth: 1956-05-30

## 2017-02-28 ENCOUNTER — Ambulatory Visit (INDEPENDENT_AMBULATORY_CARE_PROVIDER_SITE_OTHER): Payer: 59 | Admitting: Neurology

## 2017-02-28 ENCOUNTER — Ambulatory Visit (INDEPENDENT_AMBULATORY_CARE_PROVIDER_SITE_OTHER): Payer: Self-pay | Admitting: Neurology

## 2017-02-28 ENCOUNTER — Telehealth: Payer: Self-pay | Admitting: *Deleted

## 2017-02-28 DIAGNOSIS — R29898 Other symptoms and signs involving the musculoskeletal system: Secondary | ICD-10-CM | POA: Diagnosis not present

## 2017-02-28 DIAGNOSIS — Z0289 Encounter for other administrative examinations: Secondary | ICD-10-CM

## 2017-02-28 DIAGNOSIS — R200 Anesthesia of skin: Secondary | ICD-10-CM

## 2017-02-28 NOTE — Telephone Encounter (Signed)
LMVM (mobile) ok per DPR that her MRI showed some arthritic changes and disk bulging, L and R nerve impingement which could account for her sx.  Will discuss further when in for [place]/EMG. She is to call back if questions.

## 2017-02-28 NOTE — Telephone Encounter (Signed)
-----   Message from Melvenia Beam, MD sent at 02/25/2017  5:39 PM EDT ----- She has arthritic changes and disk bulging in her lower back. There may be left L3 nerve root impingement which could account for her left leg proximal symptoms. On the right, there may be L5 nerve root impingement which would explain the right leg symptoms. We will further evaluate with emg/ncs and I can review further at her emg/ncs thanks

## 2017-02-28 NOTE — Progress Notes (Signed)
Full Name: Natalie Chen Gender: Female MRN #: [id number] Date of Birth: 19-Sep-2055    Visit Date: 02/28/17 08:23 Age: 61 Years 34 Months Old Examining Physician: Sarina Ill, MD  Referring Physician: Jaynee Eagles, MD  History: 61 year old patient with right sciatica and left proximal thigh numbness. MRI of the lumbar spine shows possible right L5 radiculopathy.   Summary: The left lateral femoral cutaneous sensory nerve showed no response. All remaining nerves normal (as detailed below). All muscles normal (as detailed below).    Conclusion: There is left Meralgia Paresthetica. No suggestion of lumbar radiculopathy on emg/ncs however patient's clinical symptoms and MRI findings suggest possible right L5 nerve root involvement. Patient requests evaluation by Dr. Vertell Limber, referral placed.   Sarina Ill, M.D.  [facility] [place],  [address] Tel: [phone] Fax: [phone]        [facility]    Nerve / Sites Muscle Latency Ref. Amplitude Ref. Rel Amp Segments Distance Velocity Ref. Area    ms ms mV mV %  cm m/s m/s mVms  R Peroneal - EDB     Ankle EDB 5.2 ?6.5 4.3 ?2.0 100 Ankle - EDB 9   16.1     Fib head EDB 10.2  4.1  94.2 Fib head - Ankle 28 55 ?44 18.3     Pop fossa EDB 12.6  3.5  86.5 Pop fossa - Fib head 12 50 ?44 15.0         Pop fossa - Ankle      L Peroneal - EDB     Ankle EDB 5.8 ?6.5 2.8 ?2.0 100 Ankle - EDB 9   9.8     Fib head EDB 12.0  2.5  88.8 Fib head - Ankle 29 47 ?44 8.5     Pop fossa EDB 14.2  2.2  88.7 Pop fossa - Fib head 10 46 ?44 7.7         Pop fossa - Ankle      R Tibial - AH     Ankle AH 4.4 ?5.8 10.2 ?4.0 100 Ankle - AH 9   24.6     Pop fossa AH 11.7  8.0  77.8 Pop fossa - Ankle 34 47 ?41 21.8  L Tibial - AH     Ankle AH 3.9 ?5.8 11.3 ?4.0 100 Ankle - AH 9   25.4     Pop fossa AH 12.1  8.6  76 Pop fossa - Ankle 34 42 ?41 19.5             SNC    Nerve / Sites Rec. Site Peak Lat Ref.  Amp Ref. Segments  Distance    ms ms V V  cm  R Sural - Ankle (Calf)     Calf Ankle 3.6 ?4.4 13 ?6 Calf - Ankle 14  L Sural - Ankle (Calf)     Calf Ankle 3.3 ?4.4 16 ?6 Calf - Ankle 14  R Superficial peroneal - Ankle     Lat leg Ankle 4.1 ?4.4 10 ?6 Lat leg - Ankle 14  L Superficial peroneal - Ankle     Lat leg Ankle 4.1 ?4.4 10 ?6 Lat leg - Ankle 14  R Lateral femoral cutaneous - Thigh (Inguinal ligament)     A. Ing ligament Thigh 3.5  5  A. Ing ligament - Thigh   L Lateral femoral cutaneous - Thigh (Inguinal ligament)     A. Ing ligament Thigh  NR  NR  A. Ing ligament - Thigh                  F  Wave    Nerve F Lat Ref.   ms ms  R Tibial - AH 46.4 ?56.0  L Tibial - AH 46.1 ?56.0         EMG full       EMG Summary Table    Spontaneous MUAP Recruitment  Muscle IA Fib PSW Fasc Other Amp Dur. Poly Pattern  L. Vastus medialis Normal None None None _______ Normal Normal Normal Normal  R. Vastus medialis Normal None None None _______ Normal Normal Normal Normal  L. Tibialis anterior Normal None None None _______ Normal Normal Normal Normal  R. Tibialis anterior Normal None None None _______ Normal Normal Normal Normal  L. Gastrocnemius (Medial head) Normal None None None _______ Normal Normal Normal Normal  R. Gastrocnemius (Medial head) Normal None None None _______ Normal Normal Normal Normal  L. Biceps femoris (long head) Normal None None None _______ Normal Normal Normal Normal  R. Biceps femoris (long head) Normal None None None _______ Normal Normal Normal Normal  L. Gluteus maximus Normal None None None _______ Normal Normal Normal Normal  R. Gluteus maximus Normal None None None _______ Normal Normal Normal Normal  L. Gluteus medius Normal None None None _______ Normal Normal Normal Normal  R. Gluteus medius Normal None None None _______ Normal Normal Normal Normal  L. Lumbar paraspinals (low) Normal None None None _______ Normal Normal Normal Normal  R. Lumbar paraspinals (low) Normal None  None None _______ Normal Normal Normal Normal  L. Iliopsoas Normal None None None _______ Normal Normal Normal Normal  R. Iliopsoas Normal None None None _______ Normal Normal Normal Normal

## 2017-03-03 NOTE — Progress Notes (Signed)
See procedure note.

## 2017-03-03 NOTE — Procedures (Signed)
Full Name: Natalie Chen Gender: Female MRN #: [id number] Date of Birth: 2055/11/24    Visit Date: 02/28/17 08:23 Age: 61 Years 31 Months Old Examining Physician: Sarina Ill, MD   History: 61 year old patient with right sciatica and left proximal thigh numbness. MRI of the lumbar spine shows possible right L5 radiculopathy.   Summary: The left lateral femoral cutaneous sensory nerve showed no response. All remaining nerves normal (as detailed below). All muscles normal (as detailed below).    Conclusion: There is left Meralgia Paresthetica. No suggestion of lumbar radiculopathy on emg/ncs however patient's clinical symptoms and MRI findings suggest possible right L5 nerve root involvement. Patient requests evaluation by Dr. Vertell Limber, referral placed.   Sarina Ill, M.D.  [facility] [place], [place] [address] Tel: [phone] Fax: [phone]        [facility]    Nerve / Sites Muscle Latency Ref. Amplitude Ref. Rel Amp Segments Distance Velocity Ref. Area    ms ms mV mV %  cm m/s m/s mVms  R Peroneal - EDB     Ankle EDB 5.2 ?6.5 4.3 ?2.0 100 Ankle - EDB 9   16.1     Fib head EDB 10.2  4.1  94.2 Fib head - Ankle 28 55 ?44 18.3     Pop fossa EDB 12.6  3.5  86.5 Pop fossa - Fib head 12 50 ?44 15.0         Pop fossa - Ankle      L Peroneal - EDB     Ankle EDB 5.8 ?6.5 2.8 ?2.0 100 Ankle - EDB 9   9.8     Fib head EDB 12.0  2.5  88.8 Fib head - Ankle 29 47 ?44 8.5     Pop fossa EDB 14.2  2.2  88.7 Pop fossa - Fib head 10 46 ?44 7.7         Pop fossa - Ankle      R Tibial - AH     Ankle AH 4.4 ?5.8 10.2 ?4.0 100 Ankle - AH 9   24.6     Pop fossa AH 11.7  8.0  77.8 Pop fossa - Ankle 34 47 ?41 21.8  L Tibial - AH     Ankle AH 3.9 ?5.8 11.3 ?4.0 100 Ankle - AH 9   25.4     Pop fossa AH 12.1  8.6  76 Pop fossa - Ankle 34 42 ?41 19.5             SNC    Nerve / Sites Rec. Site Peak Lat Ref.  Amp Ref. Segments Distance    ms ms V V  cm  R Sural  - Ankle (Calf)     Calf Ankle 3.6 ?4.4 13 ?6 Calf - Ankle 14  L Sural - Ankle (Calf)     Calf Ankle 3.3 ?4.4 16 ?6 Calf - Ankle 14  R Superficial peroneal - Ankle     Lat leg Ankle 4.1 ?4.4 10 ?6 Lat leg - Ankle 14  L Superficial peroneal - Ankle     Lat leg Ankle 4.1 ?4.4 10 ?6 Lat leg - Ankle 14  R Lateral femoral cutaneous - Thigh (Inguinal ligament)     A. Ing ligament Thigh 3.5  5  A. Ing ligament - Thigh   L Lateral femoral cutaneous - Thigh (Inguinal ligament)     A. Ing ligament Thigh NR  NR  A. Ing ligament - Thigh                  F  Wave    Nerve F Lat Ref.   ms ms  R Tibial - AH 46.4 ?56.0  L Tibial - AH 46.1 ?56.0         EMG full       EMG Summary Table    Spontaneous MUAP Recruitment  Muscle IA Fib PSW Fasc Other Amp Dur. Poly Pattern  L. Vastus medialis Normal None None None _______ Normal Normal Normal Normal  R. Vastus medialis Normal None None None _______ Normal Normal Normal Normal  L. Tibialis anterior Normal None None None _______ Normal Normal Normal Normal  R. Tibialis anterior Normal None None None _______ Normal Normal Normal Normal  L. Gastrocnemius (Medial head) Normal None None None _______ Normal Normal Normal Normal  R. Gastrocnemius (Medial head) Normal None None None _______ Normal Normal Normal Normal  L. Biceps femoris (long head) Normal None None None _______ Normal Normal Normal Normal  R. Biceps femoris (long head) Normal None None None _______ Normal Normal Normal Normal  L. Gluteus maximus Normal None None None _______ Normal Normal Normal Normal  R. Gluteus maximus Normal None None None _______ Normal Normal Normal Normal  L. Gluteus medius Normal None None None _______ Normal Normal Normal Normal  R. Gluteus medius Normal None None None _______ Normal Normal Normal Normal  L. Lumbar paraspinals (low) Normal None None None _______ Normal Normal Normal Normal  R. Lumbar paraspinals (low) Normal None None None _______ Normal Normal Normal  Normal  L. Iliopsoas Normal None None None _______ Normal Normal Normal Normal  R. Iliopsoas Normal None None None _______ Normal Normal Normal Normal

## 2017-03-05 ENCOUNTER — Ambulatory Visit: Payer: 59 | Admitting: Physical Therapy

## 2017-03-05 DIAGNOSIS — M6281 Muscle weakness (generalized): Secondary | ICD-10-CM

## 2017-03-05 DIAGNOSIS — M5416 Radiculopathy, lumbar region: Secondary | ICD-10-CM

## 2017-03-05 DIAGNOSIS — Z9181 History of falling: Secondary | ICD-10-CM | POA: Diagnosis not present

## 2017-03-05 NOTE — Patient Instructions (Signed)
Flexors, Kneeling    Kneel on one leg. Slowly push pelvis down while slightly arching back until stretch is felt on front of hip. Hold __30_ seconds.  Repeat _3__ times per session.   Adductor Stretch, Standing    Stand on one leg, other leg's ankle on full foam roller at chair height. Keeping body erect, bend standing leg and as it lowers, slide raised leg out on roller. Hold _30__ seconds.  Repeat __3_ times per session. **on R knee - L leg out to side**  Forward Lunge    Standing with feet shoulder width apart and stomach tight, step forward with Right leg. Repeat __15__ times per set. Do __2__ sets per session.

## 2017-03-05 NOTE — Therapy (Signed)
[facility] [address]  [place], [place], [address] Phone: [phone]   Fax:  [phone]  Physical Therapy Treatment  Patient Details  Name: Natalie Chen MRN: [id number] Date of Birth: [birth year] Referring Provider: Dr. Sarina Ill  Encounter Date: 03/05/2017      PT End of Session - 03/05/17 0804    Visit Number 2   Number of Visits 12   Date for PT Re-Evaluation 04/09/17   PT Start Time 0800   PT Stop Time 0839   PT Time Calculation (min) 39 min   Activity Tolerance Patient tolerated treatment well   Behavior During Therapy [facility] for tasks assessed/performed      Past Medical History:  Diagnosis Date  . B12 deficiency    On B12 injections Started May 2018  . Depression   . GERD (gastroesophageal reflux disease)   . High cholesterol   . Osteoarthritis    L shoulder  . Tendinitis    left  biceps    Past Surgical History:  Procedure Laterality Date  . CESAREAN SECTION     x3  . CHOLECYSTECTOMY    . DILATION AND CURETTAGE OF UTERUS    . ROTATOR CUFF REPAIR     right- 2003, left- 02/2016    There were no vitals filed for this visit.      Subjective Assessment - 03/05/17 0803    Subjective Patient stating she feels about the same - had EMG study - meralgia paresthetica   Diagnostic tests MRI: 1.   At L3-L4 there is disc bulging more to the left and mild facet hypertrophy causing moderate left foraminal narrowing encroaching on the left L3 nerve root without causing definite nerve root compression. 2.   At L4-L5, there are degenerative changes causing mild bilateral foraminal narrowing. There is no nerve root compression. 3.   At L5-S1, there is disc bulging and right facet hypertrophy causing moderate right foraminal narrowing encroaching the right L5 nerve root without causing definite nerve root compression   Patient Stated Goals improve symptoms   Currently in Pain? Yes   Pain Score 3     Pain Location Leg   Pain Orientation Left   Pain Descriptors / Indicators Numbness   Pain Type Chronic pain                         [facility] Adult PT Treatment/Exercise - 03/05/17 0001      Exercises   Exercises Lumbar     Lumbar Exercises: Stretches   Hip Flexor Stretch 3 reps;30 seconds   Hip Flexor Stretch Limitations half-kneeling - L sided only   Quadruped Mid Back Stretch 5 reps;10 seconds   Quadruped Mid Back Stretch Limitations seated green ball rollouts - forward, right, left   Quad Stretch 3 reps;30 seconds   Quad Stretch Limitations L - prone with strap - pillow at knee     Lumbar Exercises: Aerobic   Stationary Bike L2 x 6 min     Lumbar Exercises: Standing   Forward Lunge 15 reps   Forward Lunge Limitations R foot forward   Other Standing Lumbar Exercises hip extension: straight x 15 reps, 45 degrees x 15 reps - 2# at ankle     Lumbar Exercises: Supine   Clam 15 reps   Clam Limitations B: green tband   Bridge 15 reps;5 seconds     Lumbar Exercises: Sidelying   Hip Abduction  15 reps     Knee/Hip Exercises: Stretches   Other Knee/Hip Stretches L adductor stretch - supine 3 x 30, 1/2 kneeling 3 x 30 sec                  PT Short Term Goals - 03/05/17 5597      PT SHORT TERM GOAL #1   Title patient to be independent with initial HEP   Status On-going           PT Long Term Goals - 03/05/17 4163      PT LONG TERM GOAL #1   Title patient to be independent with advanced HEP   Status On-going     PT LONG TERM GOAL #2   Title patient to improve B LE strength to >/= 4+/5   Status On-going     PT LONG TERM GOAL #3   Title patient to report pain reduction by 50% with lying and standing for prolonged times for greater than 2 weeks   Status On-going     PT LONG TERM GOAL #4   Title patient to report ability to return to walking for exercise for >/= 1 mile without pain/weakness limiting   Status On-going                Plan - 03/05/17 0807    Clinical Impression Statement Patient with recently completed EMG/NCS study with findings of meralgia paresthetica of L hip. PT session today focusing on stretching and strengthening to open up hip to allow for hopeful reduction in N&T. Patient responding well to all activities in session with no increase in symptoms.    PT Treatment/Interventions ADLs/Self Care Home Management;Cryotherapy;Electrical Stimulation;Moist Heat;Traction;Ultrasound;Iontophoresis 4mg /ml Dexamethasone;Neuromuscular re-education;Balance training;Therapeutic exercise;Therapeutic activities;Functional mobility training;Patient/family education;Manual techniques;Passive range of motion;Vasopneumatic Device;Taping;Dry needling   Consulted and Agree with Plan of Care Patient      Patient will benefit from skilled therapeutic intervention in order to improve the following deficits and impairments:  Abnormal gait, Decreased activity tolerance, Decreased mobility, Decreased strength, Difficulty walking, Pain  Visit Diagnosis: History of falling  Radiculopathy, lumbar region  Muscle weakness (generalized)     Problem List There are no active problems to display for this patient.    Lanney Gins, PT, DPT 03/05/17 8:44 AM   [facility] [address]  [place], [place], [address] Phone: [phone]   Fax:  [phone]  Name: Natalie Chen MRN: [id number] Date of Birth: 1955/08/24

## 2017-03-08 ENCOUNTER — Ambulatory Visit: Payer: 59

## 2017-03-08 DIAGNOSIS — Z9181 History of falling: Secondary | ICD-10-CM

## 2017-03-08 DIAGNOSIS — M6281 Muscle weakness (generalized): Secondary | ICD-10-CM

## 2017-03-08 DIAGNOSIS — M5416 Radiculopathy, lumbar region: Secondary | ICD-10-CM

## 2017-03-08 NOTE — Therapy (Signed)
[facility] [address]  [place], [place], [address] Phone: [phone]   Fax:  [phone]  Physical Therapy Treatment  Patient Details  Name: Natalie Chen MRN: [id number] Date of Birth: 1956-05-23 Referring Provider: Dr. Sarina Ill  Encounter Date: 03/08/2017      PT End of Session - 03/08/17 0805    Visit Number 3   Number of Visits 12   Date for PT Re-Evaluation 04/09/17   PT Start Time 0802   PT Stop Time 0840   PT Time Calculation (min) 38 min   Activity Tolerance Patient tolerated treatment well   Behavior During Therapy [facility] for tasks assessed/performed      Past Medical History:  Diagnosis Date  . B12 deficiency    On B12 injections Started May 2018  . Depression   . GERD (gastroesophageal reflux disease)   . High cholesterol   . Osteoarthritis    L shoulder  . Tendinitis    left  biceps    Past Surgical History:  Procedure Laterality Date  . CESAREAN SECTION     x3  . CHOLECYSTECTOMY    . DILATION AND CURETTAGE OF UTERUS    . ROTATOR CUFF REPAIR     right- 2003, left- 02/2016    There were no vitals filed for this visit.      Subjective Assessment - 03/08/17 0801    Subjective Pt. noting she had some pain in L LE when laying down last night.     Patient Stated Goals improve symptoms   Currently in Pain? Yes   Pain Score 3    Pain Location Leg   Pain Orientation Left   Pain Descriptors / Indicators Numbness   Pain Type Chronic pain   Aggravating Factors  lying or prolonged standing   Pain Relieving Factors sitting    Multiple Pain Sites No                         [facility] Adult PT Treatment/Exercise - 03/08/17 0817      Lumbar Exercises: Stretches   Hip Flexor Stretch 3 reps;30 seconds   Hip Flexor Stretch Limitations half-kneeling; Bil    Quadruped Mid Back Stretch 20 seconds;2 reps   Quadruped Mid Back Stretch Limitations seated green ball  rollouts - forward, right, left     Lumbar Exercises: Aerobic   Elliptical NuStep: lvl 5, 6 min      Lumbar Exercises: Supine   Clam 15 reps   Clam Limitations B: green tband   Bridge 15 reps;5 seconds   Bridge Limitations With sustained hip abd/ER with green TB at knees    Straight Leg Raise 10 reps;3 seconds   Straight Leg Raises Limitations 2#; bilateral      Knee/Hip Exercises: Stretches   Other Knee/Hip Stretches butterfly stretch 2 x 30 sec                 PT Education - 03/08/17 0844    Education provided Yes   Education Details Hooklying hip abd/ER with green TB issued to pt.    Person(s) Educated Patient   Methods Explanation;Demonstration;Verbal cues;Handout   Comprehension Verbalized understanding;Returned demonstration;Verbal cues required;Need further instruction          PT Short Term Goals - 03/05/17 0806      PT SHORT TERM GOAL #1   Title patient to be independent with initial HEP   Status On-going  PT Long Term Goals - 03/05/17 7680      PT LONG TERM GOAL #1   Title patient to be independent with advanced HEP   Status On-going     PT LONG TERM GOAL #2   Title patient to improve B LE strength to >/= 4+/5   Status On-going     PT LONG TERM GOAL #3   Title patient to report pain reduction by 50% with lying and standing for prolonged times for greater than 2 weeks   Status On-going     PT LONG TERM GOAL #4   Title patient to report ability to return to walking for exercise for >/= 1 mile without pain/weakness limiting   Status On-going               Plan - 03/08/17 8811    Clinical Impression Statement Pt. noting numbness nearly the same level today.  Has not tried updated HEP.  Good overall technique with updated HEP review with pt. demonstrating good general understanding of exercise/stretching in treatment today.  Treatment focusing on lumbopelvic strengthening and stretching with all activities tolerated well.   Continues to report numbness worst when laying supine at night in bed and with prolonged standing.  HEP updated to include clam shell with green TB issued to pt.     PT Treatment/Interventions ADLs/Self Care Home Management;Cryotherapy;Electrical Stimulation;Moist Heat;Traction;Ultrasound;Iontophoresis 4mg /ml Dexamethasone;Neuromuscular re-education;Balance training;Therapeutic exercise;Therapeutic activities;Functional mobility training;Patient/family education;Manual techniques;Passive range of motion;Vasopneumatic Device;Taping;Dry needling      Patient will benefit from skilled therapeutic intervention in order to improve the following deficits and impairments:  Abnormal gait, Decreased activity tolerance, Decreased mobility, Decreased strength, Difficulty walking, Pain  Visit Diagnosis: History of falling  Radiculopathy, lumbar region  Muscle weakness (generalized)     Problem List There are no active problems to display for this patient.   Bess Harvest, PTA 03/08/17 12:17 PM  [facility] [address]  [place], [place], [address] Phone: [phone]   Fax:  [phone]  Name: LIBERTIE HAUSLER MRN: [id number] Date of Birth: Aug 25, 1955

## 2017-03-14 ENCOUNTER — Ambulatory Visit: Payer: 59 | Admitting: Physical Therapy

## 2017-03-14 DIAGNOSIS — Z9181 History of falling: Secondary | ICD-10-CM | POA: Diagnosis not present

## 2017-03-14 DIAGNOSIS — M6281 Muscle weakness (generalized): Secondary | ICD-10-CM

## 2017-03-14 DIAGNOSIS — M5416 Radiculopathy, lumbar region: Secondary | ICD-10-CM

## 2017-03-14 NOTE — Therapy (Signed)
[facility] [address]  [place], [place], [address] Phone: [phone]   Fax:  [phone]  Physical Therapy Treatment  Patient Details  Name: Natalie Chen MRN: [id number] Date of Birth: 08-21-1955 Referring Provider: Dr. Sarina Ill  Encounter Date: 03/14/2017      PT End of Session - 03/14/17 0816    Visit Number 4   Number of Visits 12   Date for PT Re-Evaluation 04/09/17   PT Start Time 0812   PT Stop Time 0852   PT Time Calculation (min) 40 min   Activity Tolerance Patient tolerated treatment well   Behavior During Therapy [facility] for tasks assessed/performed      Past Medical History:  Diagnosis Date  . B12 deficiency    On B12 injections Started May 2018  . Depression   . GERD (gastroesophageal reflux disease)   . High cholesterol   . Osteoarthritis    L shoulder  . Tendinitis    left  biceps    Past Surgical History:  Procedure Laterality Date  . CESAREAN SECTION     x3  . CHOLECYSTECTOMY    . DILATION AND CURETTAGE OF UTERUS    . ROTATOR CUFF REPAIR     right- 2003, left- 02/2016    There were no vitals filed for this visit.      Subjective Assessment - 03/14/17 0813    Subjective Pain and N&T is about the same   Diagnostic tests MRI: 1.   At L3-L4 there is disc bulging more to the left and mild facet hypertrophy causing moderate left foraminal narrowing encroaching on the left L3 nerve root without causing definite nerve root compression. 2.   At L4-L5, there are degenerative changes causing mild bilateral foraminal narrowing. There is no nerve root compression. 3.   At L5-S1, there is disc bulging and right facet hypertrophy causing moderate right foraminal narrowing encroaching the right L5 nerve root without causing definite nerve root compression   Patient Stated Goals improve symptoms   Currently in Pain? Yes   Pain Score 2    Pain Location Leg   Pain Orientation Left   Pain Descriptors / Indicators Tingling;Numbness   Pain Type Chronic pain                         [facility] Adult PT Treatment/Exercise - 03/14/17 0817      Lumbar Exercises: Stretches   Press Ups 5 reps;10 seconds   Quadruped Mid Back Stretch Limitations Childs pose 2 x 30 sec     Lumbar Exercises: Aerobic   Elliptical NuStep: lvl 5, 6 min      Lumbar Exercises: Supine   Bridge 10 reps;5 seconds   Isometric Hip Flexion 15 reps;5 seconds   Other Supine Lumbar Exercises single leg bridge x 10 each side   Other Supine Lumbar Exercises HS bridge on peanut ball x 15     Lumbar Exercises: Prone   Other Prone Lumbar Exercises HS curl - 3# at ankle x 15 reps each   Other Prone Lumbar Exercises bent knee hip extension - 3# at ankle x 15 reps each     Lumbar Exercises: Quadruped   [place] Horse 10 reps                  PT Short Term Goals - 03/05/17 0806      PT SHORT TERM GOAL #1   Title  patient to be independent with initial HEP   Status On-going           PT Long Term Goals - 03/05/17 3329      PT LONG TERM GOAL #1   Title patient to be independent with advanced HEP   Status On-going     PT LONG TERM GOAL #2   Title patient to improve B LE strength to >/= 4+/5   Status On-going     PT LONG TERM GOAL #3   Title patient to report pain reduction by 50% with lying and standing for prolonged times for greater than 2 weeks   Status On-going     PT LONG TERM GOAL #4   Title patient to report ability to return to walking for exercise for >/= 1 mile without pain/weakness limiting   Status On-going               Plan - 03/14/17 0817    Clinical Impression Statement Patient doing well today - reports conitnued pain and N&T in legs that has remained about the same. Doing well with all hip stretches and strengthening with no issue. Did discuss possible trial of DN at next session at [facility] as patient feels she still has a good bit of pain  here.    PT Treatment/Interventions ADLs/Self Care Home Management;Cryotherapy;Electrical Stimulation;Moist Heat;Traction;Ultrasound;Iontophoresis 4mg /ml Dexamethasone;Neuromuscular re-education;Balance training;Therapeutic exercise;Therapeutic activities;Functional mobility training;Patient/family education;Manual techniques;Passive range of motion;Vasopneumatic Device;Taping;Dry needling   Consulted and Agree with Plan of Care Patient      Patient will benefit from skilled therapeutic intervention in order to improve the following deficits and impairments:  Abnormal gait, Decreased activity tolerance, Decreased mobility, Decreased strength, Difficulty walking, Pain  Visit Diagnosis: History of falling  Radiculopathy, lumbar region  Muscle weakness (generalized)     Problem List There are no active problems to display for this patient.    Lanney Gins, PT, DPT 03/14/17 11:45 AM   [facility] [address]  [place], [place], [address] Phone: [phone]   Fax:  [phone]  Name: Natalie Chen MRN: [id number] Date of Birth: 06-10-1956

## 2017-03-14 NOTE — Patient Instructions (Signed)
EXTENSION: Prone - Knee Flexed (Active)    Lie on stomach, right knee bent to 90. Lift leg toward ceiling. Use ___ lbs. Complete _2__ sets of __15_ repetitions.     Single Leg Bridge  [employer]

## 2017-03-20 ENCOUNTER — Encounter: Payer: 59 | Admitting: Neurology

## 2017-03-22 ENCOUNTER — Ambulatory Visit: Payer: 59 | Attending: Neurology | Admitting: Physical Therapy

## 2017-03-22 DIAGNOSIS — Z9181 History of falling: Secondary | ICD-10-CM | POA: Diagnosis not present

## 2017-03-22 DIAGNOSIS — M5416 Radiculopathy, lumbar region: Secondary | ICD-10-CM

## 2017-03-22 DIAGNOSIS — M6281 Muscle weakness (generalized): Secondary | ICD-10-CM | POA: Diagnosis present

## 2017-03-22 NOTE — Therapy (Signed)
[facility] [address]  [place], [place], [address] Phone: [phone]   Fax:  [phone]  Physical Therapy Treatment  Patient Details  Name: Natalie Chen MRN: [id number] Date of Birth: [birth year] Referring Provider: Dr. Sarina Ill  Encounter Date: 03/22/2017      PT End of Session - 03/22/17 0807    Visit Number 5   Number of Visits 12   Date for PT Re-Evaluation 04/09/17   PT Start Time 0802   PT Stop Time 0846   PT Time Calculation (min) 44 min   Activity Tolerance Patient tolerated treatment well   Behavior During Therapy [facility] for tasks assessed/performed      Past Medical History:  Diagnosis Date  . B12 deficiency    On B12 injections Started May 2018  . Depression   . GERD (gastroesophageal reflux disease)   . High cholesterol   . Osteoarthritis    L shoulder  . Tendinitis    left  biceps    Past Surgical History:  Procedure Laterality Date  . CESAREAN SECTION     x3  . CHOLECYSTECTOMY    . DILATION AND CURETTAGE OF UTERUS    . ROTATOR CUFF REPAIR     right- 2003, left- 02/2016    There were no vitals filed for this visit.      Subjective Assessment - 03/22/17 0805    Subjective Feels she may be getting some better  - N&T is not immediate onset as it was before, R leg pain is also better   Diagnostic tests MRI: 1.   At L3-L4 there is disc bulging more to the left and mild facet hypertrophy causing moderate left foraminal narrowing encroaching on the left L3 nerve root without causing definite nerve root compression. 2.   At L4-L5, there are degenerative changes causing mild bilateral foraminal narrowing. There is no nerve root compression. 3.   At L5-S1, there is disc bulging and right facet hypertrophy causing moderate right foraminal narrowing encroaching the right L5 nerve root without causing definite nerve root compression   Patient Stated Goals improve symptoms   Currently  in Pain? Yes   Pain Score 2   0/10 R leg pain   Pain Location Leg   Pain Descriptors / Indicators Tingling;Numbness   Pain Type Chronic pain                         [facility] Adult PT Treatment/Exercise - 03/22/17 0808      Exercises   Exercises Lumbar     Lumbar Exercises: Stretches   Quad Stretch 3 reps;60 seconds   Quad Stretch Limitations L - prone with strap - 1/2 FR at knee     Lumbar Exercises: Aerobic   Stationary Bike L2 x 6 min     Lumbar Exercises: Supine   Other Supine Lumbar Exercises single leg bridge x 12 each side   Other Supine Lumbar Exercises alternating hip flexion - red tband - with B LE extended on peanut ball     Lumbar Exercises: Quadruped   Straight Leg Raise 15 reps   Straight Leg Raises Limitations bilateral     Manual Therapy   Manual Therapy Soft tissue mobilization;Taping   Manual therapy comments patient supine   Soft tissue mobilization STM to L anterior thigh/groin region   Kinesiotex Create Space     Kinesiotix   Create Space meralgia paresthetica tapingpattern  Ankle Exercises: Seated   Other Seated Ankle Exercises Resisted PF, inversion, eversion x 15 with red tband                  PT Short Term Goals - 03/22/17 0807      PT SHORT TERM GOAL #1   Title patient to be independent with initial HEP   Status Achieved           PT Long Term Goals - 03/05/17 3817      PT LONG TERM GOAL #1   Title patient to be independent with advanced HEP   Status On-going     PT LONG TERM GOAL #2   Title patient to improve B LE strength to >/= 4+/5   Status On-going     PT LONG TERM GOAL #3   Title patient to report pain reduction by 50% with lying and standing for prolonged times for greater than 2 weeks   Status On-going     PT LONG TERM GOAL #4   Title patient to report ability to return to walking for exercise for >/= 1 mile without pain/weakness limiting   Status On-going               Plan -  03/22/17 0807    Clinical Impression Statement Patient today reporitng slight improvements in pain and N&T. Doing well with all hip stretching and strengthening today with no issue. Applied taping pattern to L lateral thigh for hopeful improvement in symptoms. Along with continued discussion of DN trial with verbal understanding. Patient also given R ankle strengthening activities as she feels this is a problem area for her and feels that her ankle often gives way.    PT Treatment/Interventions ADLs/Self Care Home Management;Cryotherapy;Electrical Stimulation;Moist Heat;Traction;Ultrasound;Iontophoresis 4mg /ml Dexamethasone;Neuromuscular re-education;Balance training;Therapeutic exercise;Therapeutic activities;Functional mobility training;Patient/family education;Manual techniques;Passive range of motion;Vasopneumatic Device;Taping;Dry needling   Consulted and Agree with Plan of Care Patient      Patient will benefit from skilled therapeutic intervention in order to improve the following deficits and impairments:  Abnormal gait, Decreased activity tolerance, Decreased mobility, Decreased strength, Difficulty walking, Pain  Visit Diagnosis: History of falling  Radiculopathy, lumbar region  Muscle weakness (generalized)     Problem List There are no active problems to display for this patient.   Lanney Gins, PT, DPT 03/22/17 8:53 AM    [facility] [address]  [place], [place], [address] Phone: [phone]   Fax:  [phone]  Name: TASHIMA SCARPULLA MRN: [id number] Date of Birth: 11-26-1955

## 2017-03-22 NOTE — Patient Instructions (Signed)
Plantar Flexion: Resisted    Anchor behind, tubing around left foot, press down. Repeat _15___ times per set. Do ___2_ sets per session. Inversion: Resisted    Cross legs with right leg underneath, foot in tubing loop. Hold tubing around other foot to resist and turn foot in. Repeat _15___ times per set. Do __2__ sets per session.   Eversion: Resisted    With right foot in tubing loop, hold tubing around other foot to resist and turn foot out. Repeat __15__ times per set. Do __2__ sets per session.

## 2017-03-28 ENCOUNTER — Ambulatory Visit: Payer: 59 | Admitting: Physical Therapy

## 2017-03-28 DIAGNOSIS — M6281 Muscle weakness (generalized): Secondary | ICD-10-CM

## 2017-03-28 DIAGNOSIS — Z9181 History of falling: Secondary | ICD-10-CM | POA: Diagnosis not present

## 2017-03-28 DIAGNOSIS — M5416 Radiculopathy, lumbar region: Secondary | ICD-10-CM

## 2017-03-28 NOTE — Therapy (Signed)
[facility] [address]  [place], [place], [address] Phone: [phone]   Fax:  [phone]  Physical Therapy Treatment  Patient Details  Name: Natalie Chen MRN: [id number] Date of Birth: December 10, 1955 Referring Provider: Dr. Sarina Ill  Encounter Date: 03/28/2017      PT End of Session - 03/28/17 0810    Visit Number 6   Number of Visits 12   Date for PT Re-Evaluation 04/09/17   PT Start Time 0804   PT Stop Time 0845   PT Time Calculation (min) 41 min   Activity Tolerance Patient tolerated treatment well   Behavior During Therapy [facility] for tasks assessed/performed      Past Medical History:  Diagnosis Date  . B12 deficiency    On B12 injections Started May 2018  . Depression   . GERD (gastroesophageal reflux disease)   . High cholesterol   . Osteoarthritis    L shoulder  . Tendinitis    left  biceps    Past Surgical History:  Procedure Laterality Date  . CESAREAN SECTION     x3  . CHOLECYSTECTOMY    . DILATION AND CURETTAGE OF UTERUS    . ROTATOR CUFF REPAIR     right- 2003, left- 02/2016    There were no vitals filed for this visit.      Subjective Assessment - 03/28/17 0809    Subjective Tape helped some; numbness coming back since tape came off   Diagnostic tests MRI: 1.   At L3-L4 there is disc bulging more to the left and mild facet hypertrophy causing moderate left foraminal narrowing encroaching on the left L3 nerve root without causing definite nerve root compression. 2.   At L4-L5, there are degenerative changes causing mild bilateral foraminal narrowing. There is no nerve root compression. 3.   At L5-S1, there is disc bulging and right facet hypertrophy causing moderate right foraminal narrowing encroaching the right L5 nerve root without causing definite nerve root compression   Patient Stated Goals improve symptoms   Currently in Pain? Yes   Pain Score 2    Pain Location Leg    Pain Orientation Left   Pain Descriptors / Indicators Tingling;Numbness                         [facility] Adult PT Treatment/Exercise - 03/28/17 0001      Lumbar Exercises: Stretches   Quad Stretch 3 reps;60 seconds   Quad Stretch Limitations L - prone with strap - 1/2 FR at knee     Lumbar Exercises: Supine   Isometric Hip Flexion 15 reps;5 seconds   Isometric Hip Flexion Limitations feet resting on ornage physioball   Other Supine Lumbar Exercises single leg bridge x 15 L LE only     Manual Therapy   Manual Therapy Soft tissue mobilization;Taping   Manual therapy comments patient supine   Soft tissue mobilization STM to L anterior thigh/groin region   Kinesiotex Create Space     Kinesiotix   Create Space meralgia paresthetica tapingpattern          Trigger Point Dry Needling - 03/28/17 1148    Consent Given? Yes   Education Handout Provided Yes   Muscles Treated Lower Body Quadriceps   Quadriceps Response Twitch response elicited;Palpable increased muscle length                PT Short Term Goals - 03/22/17  0807      PT SHORT TERM GOAL #1   Title patient to be independent with initial HEP   Status Achieved           PT Long Term Goals - 03/05/17 8101      PT LONG TERM GOAL #1   Title patient to be independent with advanced HEP   Status On-going     PT LONG TERM GOAL #2   Title patient to improve B LE strength to >/= 4+/5   Status On-going     PT LONG TERM GOAL #3   Title patient to report pain reduction by 50% with lying and standing for prolonged times for greater than 2 weeks   Status On-going     PT LONG TERM GOAL #4   Title patient to report ability to return to walking for exercise for >/= 1 mile without pain/weakness limiting   Status On-going               Plan - 03/28/17 1256    Clinical Impression Statement Patient noting benefit from taping while applied, however, return of N&T with removal of tape. DN  trial today with patient responding well, and taping pattern for continued relief of N&T. Paitent doing well with all home stretching activities. WIll continue to progress as patient tolerates.    PT Treatment/Interventions ADLs/Self Care Home Management;Cryotherapy;Electrical Stimulation;Moist Heat;Traction;Ultrasound;Iontophoresis 4mg /ml Dexamethasone;Neuromuscular re-education;Balance training;Therapeutic exercise;Therapeutic activities;Functional mobility training;Patient/family education;Manual techniques;Passive range of motion;Vasopneumatic Device;Taping;Dry needling   Consulted and Agree with Plan of Care Patient      Patient will benefit from skilled therapeutic intervention in order to improve the following deficits and impairments:  Abnormal gait, Decreased activity tolerance, Decreased mobility, Decreased strength, Difficulty walking, Pain  Visit Diagnosis: History of falling  Radiculopathy, lumbar region  Muscle weakness (generalized)     Problem List There are no active problems to display for this patient.    Lanney Gins, PT, DPT 03/28/17 1:01 PM   [facility] [address]  [place], [place], [address] Phone: [phone]   Fax:  [phone]  Name: JAYDEE INGMAN MRN: [id number] Date of Birth: Oct 11, 1955

## 2017-03-28 NOTE — Patient Instructions (Signed)

## 2017-04-04 ENCOUNTER — Ambulatory Visit: Payer: 59 | Admitting: Physical Therapy

## 2017-04-11 ENCOUNTER — Ambulatory Visit: Payer: 59 | Admitting: Physical Therapy

## 2017-04-11 DIAGNOSIS — Z9181 History of falling: Secondary | ICD-10-CM

## 2017-04-11 DIAGNOSIS — M6281 Muscle weakness (generalized): Secondary | ICD-10-CM

## 2017-04-11 DIAGNOSIS — M5416 Radiculopathy, lumbar region: Secondary | ICD-10-CM

## 2017-04-11 NOTE — Patient Instructions (Signed)
Flexors, Prone Half Bow   Lie prone, one arm forward, other hand grasping same-side foot. Inhale and arch upper torso, pushing foot out against hand, lifting leg. Press floor with outstretched hand for support. Hold _30-60__ seconds.  Repeat _3__ times per session.   EXTENSION: Prone - Knee Flexed (Active)   Lie on stomach, right knee bent to 90. Lift leg toward ceiling. Use ___ lbs. Complete __2 sets of __15_ repetitions.   Bridge   Lie back, legs bent. Inhale, pressing hips up. Keeping ribs in, lengthen lower back. Exhale, rolling down along spine from top. Repeat __15__ times. Do __2__ sessions per day. **can use green band around knees**  Single leg bridge  With one foot on floor - lift hips - 15 reps  Lunge   Place left leg forward. Bend both knees, keeping head and back straight. May hold ____ pound weight in each hand. Repeat __15__ times. Do __2__ sessions per day. CAUTION: Move slowly. May lightly hold chair for stability.  Open book

## 2017-04-11 NOTE — Therapy (Addendum)
[facility] [address]  [address] [place], [place], [address] Phone: [phone]   Fax:  [phone]  Physical Therapy Treatment  Patient Details  Name: Natalie Chen MRN: [id number] Date of Birth: July 09, 1955 Referring Provider: Dr. Naomie Dean  Encounter Date: 04/11/2017      PT End of Session - 04/11/17 0803    Visit Number 7   Number of Visits 12   Date for PT Re-Evaluation 04/09/17   PT Start Time 0757   PT Stop Time 0838   PT Time Calculation (min) 41 min   Activity Tolerance Patient tolerated treatment well   Behavior During Therapy [facility] for tasks assessed/performed      Past Medical History:  Diagnosis Date  . B12 deficiency    On B12 injections Started May 2018  . Depression   . GERD (gastroesophageal reflux disease)   . High cholesterol   . Osteoarthritis    L shoulder  . Tendinitis    left  biceps    Past Surgical History:  Procedure Laterality Date  . CESAREAN SECTION     x3  . CHOLECYSTECTOMY    . DILATION AND CURETTAGE OF UTERUS    . ROTATOR CUFF REPAIR     right- 2003, left- 02/2016    There were no vitals filed for this visit.      Subjective Assessment - 04/11/17 0801    Subjective Doesn't think DN helped much   Diagnostic tests MRI: 1.   At L3-L4 there is disc bulging more to the left and mild facet hypertrophy causing moderate left foraminal narrowing encroaching on the left L3 nerve root without causing definite nerve root compression. 2.   At L4-L5, there are degenerative changes causing mild bilateral foraminal narrowing. There is no nerve root compression. 3.   At L5-S1, there is disc bulging and right facet hypertrophy causing moderate right foraminal narrowing encroaching the right L5 nerve root without causing definite nerve root compression   Patient Stated Goals improve symptoms   Currently in Pain? Yes   Pain Score 1    Pain Location Leg   Pain Orientation Left    Pain Descriptors / Indicators Numbness   Pain Type Chronic pain                         [facility] Adult PT Treatment/Exercise - 04/11/17 0806      Lumbar Exercises: Stretches   Quad Stretch 3 reps;60 seconds   Quad Stretch Limitations L - prone with strap - 1/2 FR at knee     Lumbar Exercises: Aerobic   Stationary Bike L2 x 6 min     Lumbar Exercises: Standing   Forward Lunge 15 reps   Forward Lunge Limitations R foot forward   Other Standing Lumbar Exercises hip extension: straight x 15 reps, 45 degrees x 15 reps - 3# at ankle     Lumbar Exercises: Supine   Bridge 15 reps;3 seconds   Bridge Limitations green tband at knees   Other Supine Lumbar Exercises single leg bridge x 15 L LE only     Lumbar Exercises: Prone   Other Prone Lumbar Exercises bent knee hip extension - 2 x 15 reps                  PT Short Term Goals - 03/22/17 3936      PT SHORT TERM GOAL #1   Title patient to  be independent with initial HEP   Status Achieved           PT Long Term Goals - 04/11/17 1610      PT LONG TERM GOAL #1   Title patient to be independent with advanced HEP   Status Achieved     PT LONG TERM GOAL #2   Title patient to improve B LE strength to >/= 4+/5   Status Achieved     PT LONG TERM GOAL #3   Title patient to report pain reduction by 50% with lying and standing for prolonged times for greater than 2 weeks   Status Achieved     PT LONG TERM GOAL #4   Title patient to report ability to return to walking for exercise for >/= 1 mile without pain/weakness limiting   Status Achieved               Plan - 04/11/17 0839    Clinical Impression Statement Patient noting 60-65% improvement in symptoms since beginning therapy with N&T redcued to 1/10 as well as redcution in time since onset when lying supine. Patient meeting all established goals at this point in time with PT and patient discussing 30 day hold with both in agreement as patient is  able to transition to independent HEP at this time with no issue. Also educated patient on self taping patterns as patient has found benefit from this.    PT Treatment/Interventions ADLs/Self Care Home Management;Cryotherapy;Electrical Stimulation;Moist Heat;Traction;Ultrasound;Iontophoresis '4mg'$ /ml Dexamethasone;Neuromuscular re-education;Balance training;Therapeutic exercise;Therapeutic activities;Functional mobility training;Patient/family education;Manual techniques;Passive range of motion;Vasopneumatic Device;Taping;Dry needling   PT Next Visit Plan 30 day hold   Consulted and Agree with Plan of Care Patient      Patient will benefit from skilled therapeutic intervention in order to improve the following deficits and impairments:  Abnormal gait, Decreased activity tolerance, Decreased mobility, Decreased strength, Difficulty walking, Pain  Visit Diagnosis: History of falling  Radiculopathy, lumbar region  Muscle weakness (generalized)     Problem List There are no active problems to display for this patient.   Lanney Gins, PT, DPT 04/11/17 8:43 AM   PHYSICAL THERAPY DISCHARGE SUMMARY  Visits from Start of Care: 7  Current functional level related to goals / functional outcomes: See above   Remaining deficits: See above   Education / Equipment: HEP  Plan: Patient agrees to discharge.  Patient goals were met. Patient is being discharged due to meeting the stated rehab goals.  ?????    Lanney Gins, PT, DPT 05/07/17 2:24 PM   [facility] [address]  [place], [place], [address] Phone: [phone]   Fax:  [phone]  Name: SHANIN SZYMANOWSKI MRN: [id number] Date of Birth: [birth year]

## 2017-06-25 DIAGNOSIS — H43393 Other vitreous opacities, bilateral: Secondary | ICD-10-CM | POA: Diagnosis not present

## 2017-06-25 DIAGNOSIS — H10413 Chronic giant papillary conjunctivitis, bilateral: Secondary | ICD-10-CM | POA: Diagnosis not present

## 2017-06-25 DIAGNOSIS — H04123 Dry eye syndrome of bilateral lacrimal glands: Secondary | ICD-10-CM | POA: Diagnosis not present

## 2017-07-15 DIAGNOSIS — H2511 Age-related nuclear cataract, right eye: Secondary | ICD-10-CM | POA: Diagnosis not present

## 2017-07-29 DIAGNOSIS — H2511 Age-related nuclear cataract, right eye: Secondary | ICD-10-CM | POA: Diagnosis not present

## 2017-07-29 DIAGNOSIS — H25811 Combined forms of age-related cataract, right eye: Secondary | ICD-10-CM | POA: Diagnosis not present

## 2017-08-06 DIAGNOSIS — H2512 Age-related nuclear cataract, left eye: Secondary | ICD-10-CM | POA: Diagnosis not present

## 2017-08-19 DIAGNOSIS — H25812 Combined forms of age-related cataract, left eye: Secondary | ICD-10-CM | POA: Diagnosis not present

## 2017-08-19 DIAGNOSIS — H2512 Age-related nuclear cataract, left eye: Secondary | ICD-10-CM | POA: Diagnosis not present

## 2018-01-14 DIAGNOSIS — H43393 Other vitreous opacities, bilateral: Secondary | ICD-10-CM | POA: Diagnosis not present

## 2018-01-14 DIAGNOSIS — H26491 Other secondary cataract, right eye: Secondary | ICD-10-CM | POA: Diagnosis not present

## 2018-01-14 DIAGNOSIS — H04123 Dry eye syndrome of bilateral lacrimal glands: Secondary | ICD-10-CM | POA: Diagnosis not present

## 2018-01-14 DIAGNOSIS — H10413 Chronic giant papillary conjunctivitis, bilateral: Secondary | ICD-10-CM | POA: Diagnosis not present

## 2018-01-21 DIAGNOSIS — L570 Actinic keratosis: Secondary | ICD-10-CM | POA: Diagnosis not present

## 2018-01-21 DIAGNOSIS — D2272 Melanocytic nevi of left lower limb, including hip: Secondary | ICD-10-CM | POA: Diagnosis not present

## 2018-01-21 DIAGNOSIS — Z85828 Personal history of other malignant neoplasm of skin: Secondary | ICD-10-CM | POA: Diagnosis not present

## 2018-01-21 DIAGNOSIS — Z808 Family history of malignant neoplasm of other organs or systems: Secondary | ICD-10-CM | POA: Diagnosis not present

## 2018-02-04 DIAGNOSIS — H26492 Other secondary cataract, left eye: Secondary | ICD-10-CM | POA: Diagnosis not present

## 2018-02-18 IMAGING — CR DG LUMBAR SPINE 2-3V
3 series · 3 of 3 positions shown · non-contrast
Comparison: CT 03/25/2012.

CLINICAL DATA: Back pain.

EXAM:
LUMBAR SPINE - 2-3 VIEW

[t l-spine a.p.]
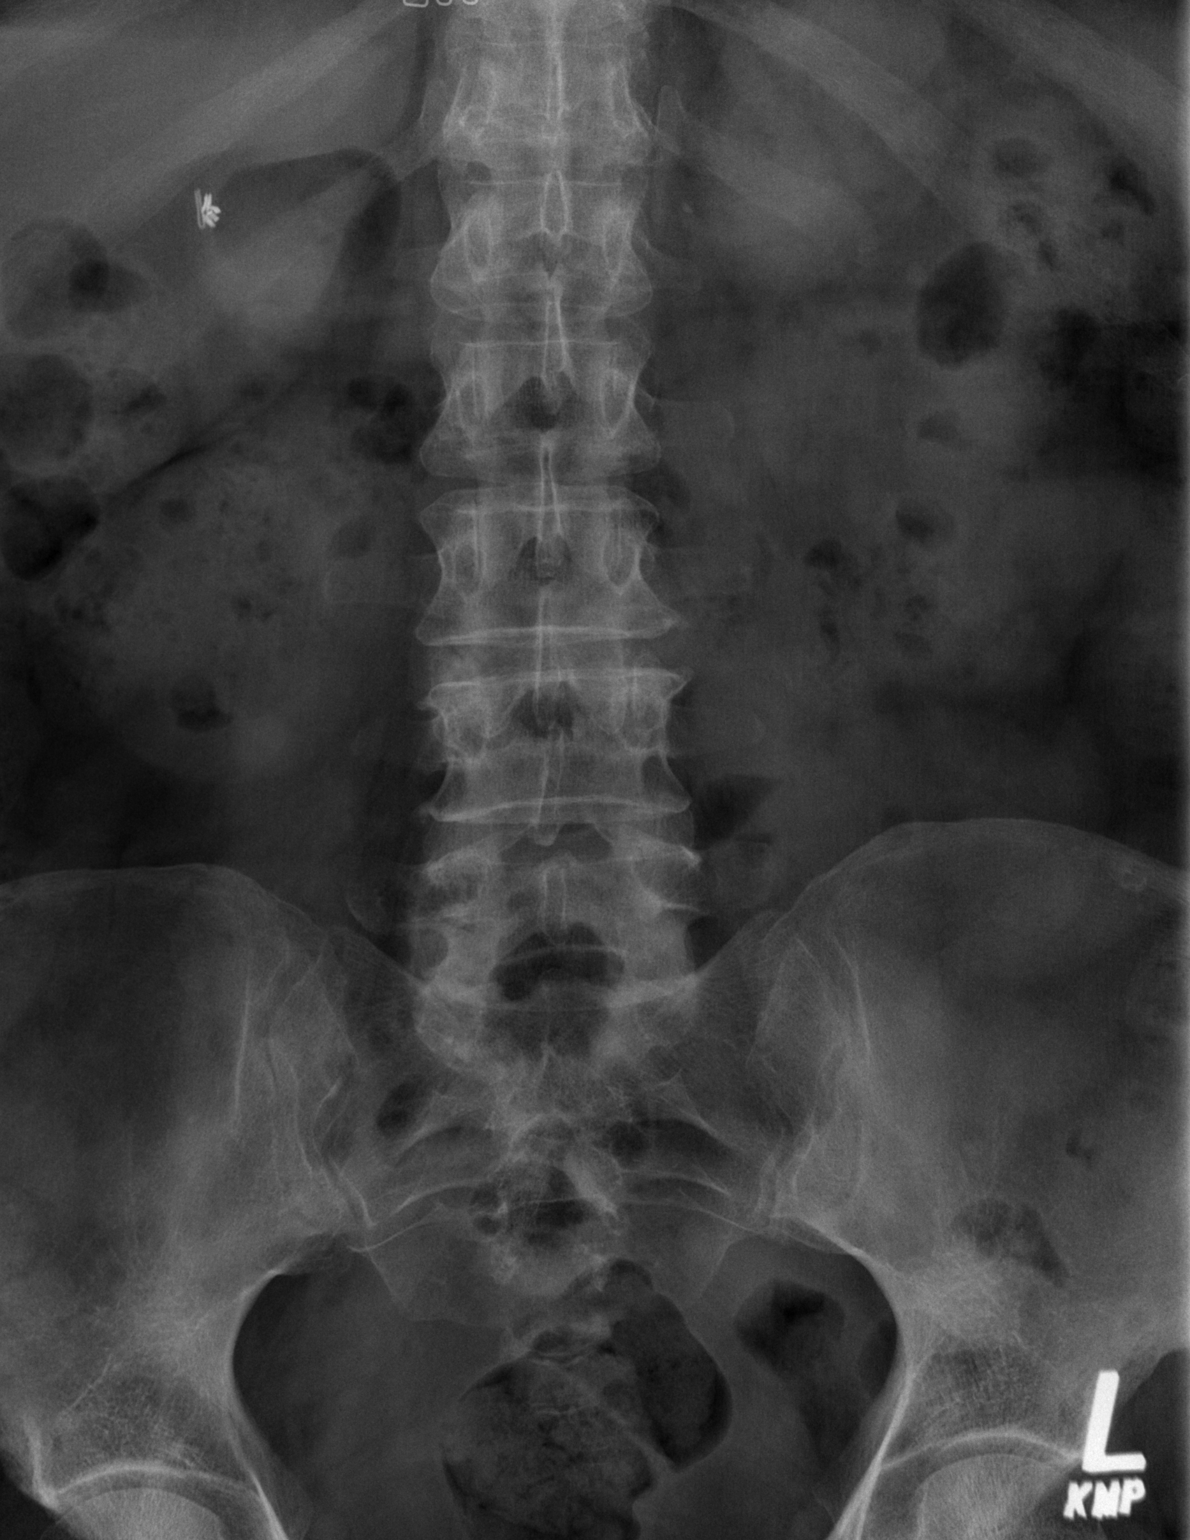

[t l-spine lat]
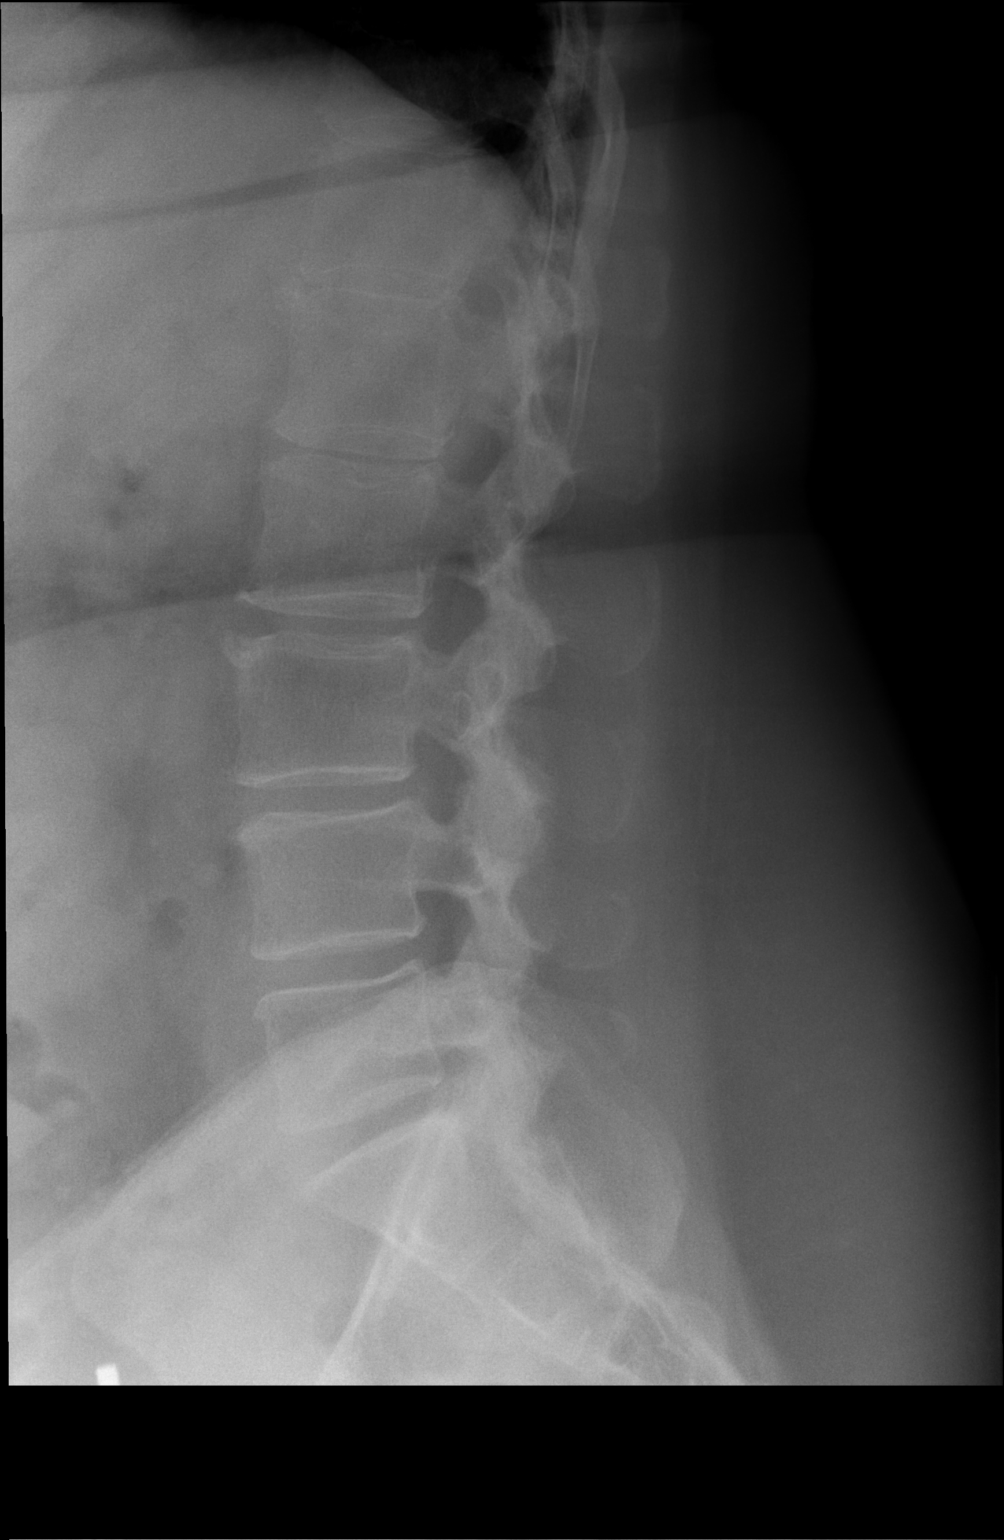

[t l-spine l5-s1 spot]
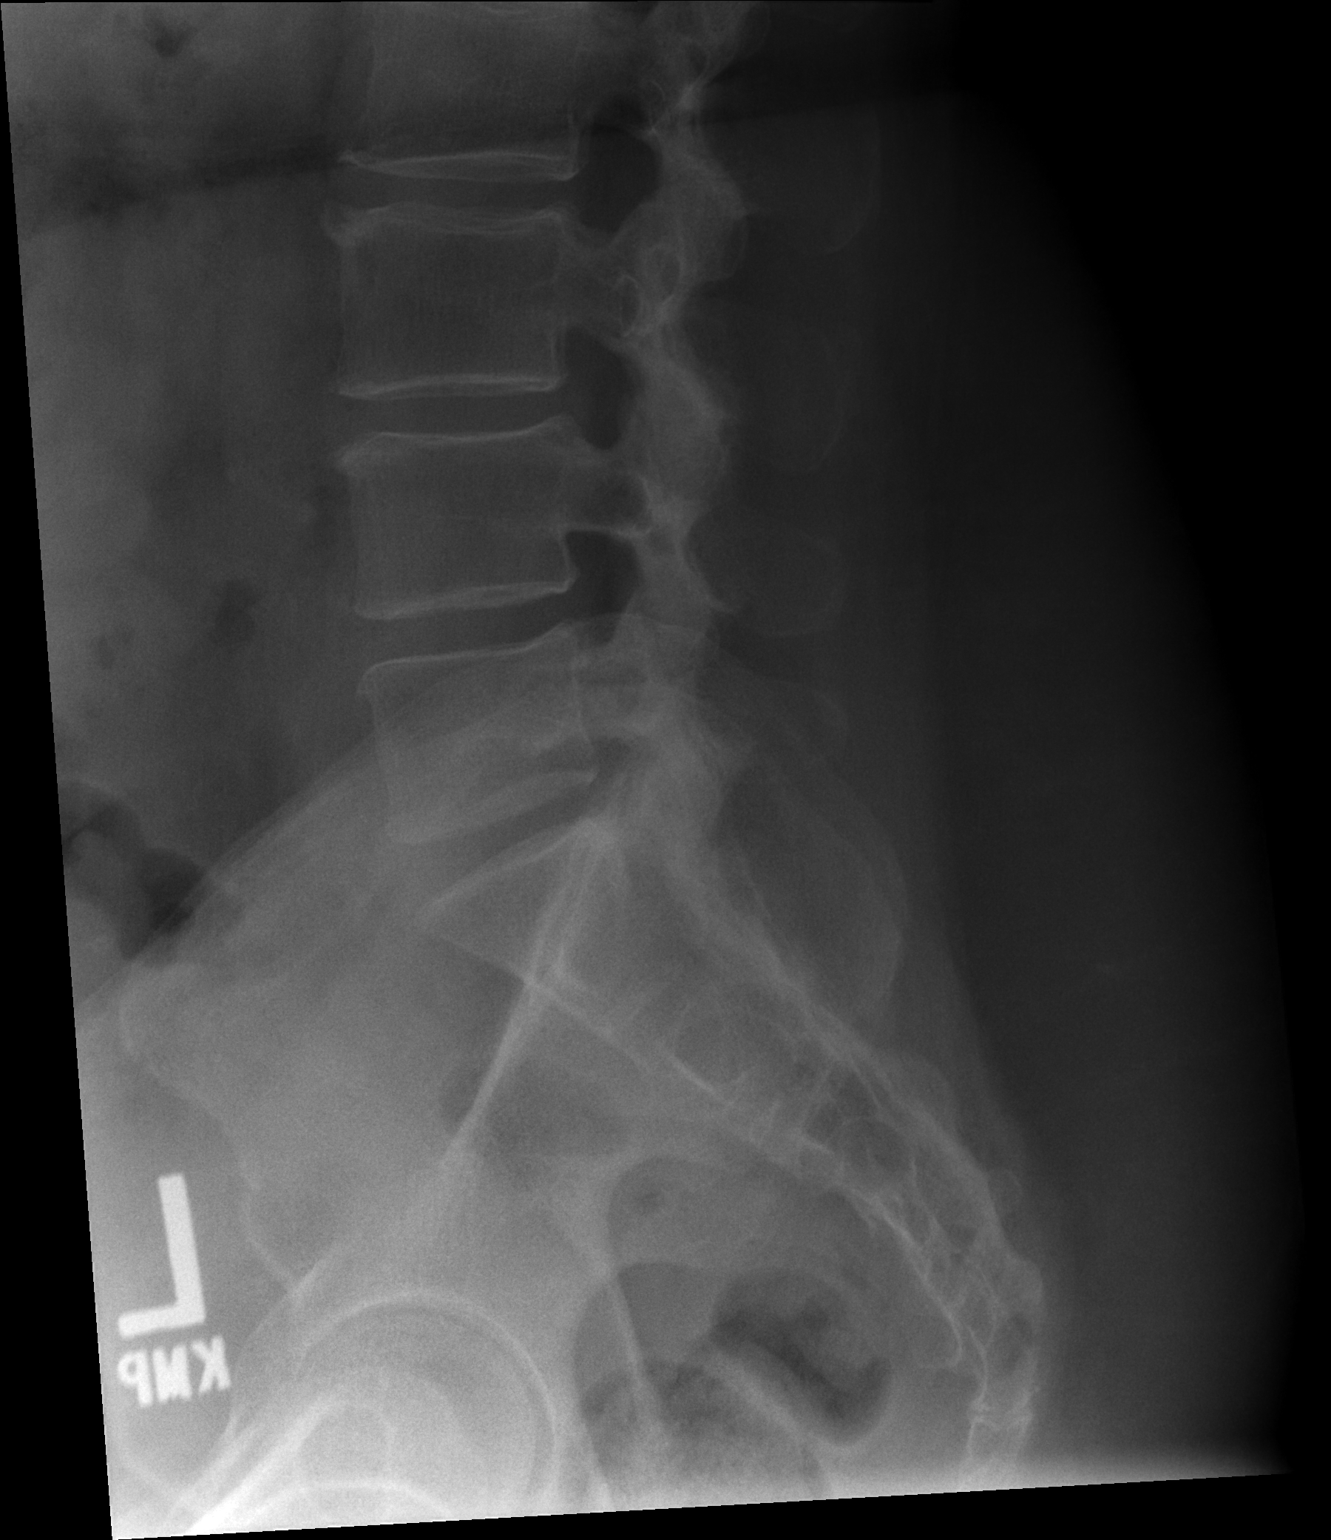

[3 of 3 positions shown; findings below may reference images not displayed]

FINDINGS: Surgical clips right upper quadrant. Diffuse osteopenia and
multilevel degenerative change. No acute bony abnormality
identified. Normal alignment.
IMPRESSION: Diffuse osteopenia and degenerative change no acute abnormality
identified.

## 2018-05-28 DIAGNOSIS — G4730 Sleep apnea, unspecified: Secondary | ICD-10-CM | POA: Diagnosis not present

## 2018-05-28 DIAGNOSIS — R5383 Other fatigue: Secondary | ICD-10-CM | POA: Diagnosis not present

## 2018-05-28 DIAGNOSIS — E7800 Pure hypercholesterolemia, unspecified: Secondary | ICD-10-CM | POA: Diagnosis not present

## 2018-05-28 DIAGNOSIS — E538 Deficiency of other specified B group vitamins: Secondary | ICD-10-CM | POA: Diagnosis not present

## 2018-08-18 DIAGNOSIS — H16223 Keratoconjunctivitis sicca, not specified as Sjogren's, bilateral: Secondary | ICD-10-CM | POA: Diagnosis not present

## 2018-08-18 DIAGNOSIS — H04203 Unspecified epiphora, bilateral lacrimal glands: Secondary | ICD-10-CM | POA: Diagnosis not present

## 2018-08-25 DIAGNOSIS — H04563 Stenosis of bilateral lacrimal punctum: Secondary | ICD-10-CM | POA: Diagnosis not present

## 2019-04-02 DIAGNOSIS — F411 Generalized anxiety disorder: Secondary | ICD-10-CM | POA: Diagnosis not present

## 2019-04-02 DIAGNOSIS — F331 Major depressive disorder, recurrent, moderate: Secondary | ICD-10-CM | POA: Diagnosis not present

## 2019-04-07 DIAGNOSIS — S61210A Laceration without foreign body of right index finger without damage to nail, initial encounter: Secondary | ICD-10-CM | POA: Diagnosis not present

## 2019-04-07 DIAGNOSIS — S61411A Laceration without foreign body of right hand, initial encounter: Secondary | ICD-10-CM | POA: Diagnosis not present

## 2019-04-07 DIAGNOSIS — Z23 Encounter for immunization: Secondary | ICD-10-CM | POA: Diagnosis not present

## 2019-06-04 DIAGNOSIS — F331 Major depressive disorder, recurrent, moderate: Secondary | ICD-10-CM | POA: Diagnosis not present

## 2019-06-04 DIAGNOSIS — F411 Generalized anxiety disorder: Secondary | ICD-10-CM | POA: Diagnosis not present

## 2019-07-03 DIAGNOSIS — Z20828 Contact with and (suspected) exposure to other viral communicable diseases: Secondary | ICD-10-CM | POA: Diagnosis not present

## 2019-08-28 DIAGNOSIS — Z03818 Encounter for observation for suspected exposure to other biological agents ruled out: Secondary | ICD-10-CM | POA: Diagnosis not present

## 2019-08-28 DIAGNOSIS — Z20828 Contact with and (suspected) exposure to other viral communicable diseases: Secondary | ICD-10-CM | POA: Diagnosis not present

## 2019-09-02 DIAGNOSIS — F411 Generalized anxiety disorder: Secondary | ICD-10-CM | POA: Diagnosis not present

## 2019-09-02 DIAGNOSIS — F331 Major depressive disorder, recurrent, moderate: Secondary | ICD-10-CM | POA: Diagnosis not present

## 2019-09-08 DIAGNOSIS — M545 Low back pain: Secondary | ICD-10-CM | POA: Diagnosis not present

## 2019-10-14 DIAGNOSIS — Z1159 Encounter for screening for other viral diseases: Secondary | ICD-10-CM | POA: Diagnosis not present

## 2019-10-19 DIAGNOSIS — Z1211 Encounter for screening for malignant neoplasm of colon: Secondary | ICD-10-CM | POA: Diagnosis not present

## 2019-10-19 DIAGNOSIS — K635 Polyp of colon: Secondary | ICD-10-CM | POA: Diagnosis not present

## 2019-10-19 DIAGNOSIS — K5730 Diverticulosis of large intestine without perforation or abscess without bleeding: Secondary | ICD-10-CM | POA: Diagnosis not present

## 2019-10-19 DIAGNOSIS — D123 Benign neoplasm of transverse colon: Secondary | ICD-10-CM | POA: Diagnosis not present

## 2019-10-19 DIAGNOSIS — Z8371 Family history of colonic polyps: Secondary | ICD-10-CM | POA: Diagnosis not present

## 2019-10-19 DIAGNOSIS — K621 Rectal polyp: Secondary | ICD-10-CM | POA: Diagnosis not present

## 2019-10-19 DIAGNOSIS — Z800 Family history of malignant neoplasm of digestive organs: Secondary | ICD-10-CM | POA: Diagnosis not present

## 2019-12-01 DIAGNOSIS — F411 Generalized anxiety disorder: Secondary | ICD-10-CM | POA: Diagnosis not present

## 2019-12-01 DIAGNOSIS — F331 Major depressive disorder, recurrent, moderate: Secondary | ICD-10-CM | POA: Diagnosis not present

## 2020-03-30 DIAGNOSIS — L570 Actinic keratosis: Secondary | ICD-10-CM | POA: Diagnosis not present

## 2020-03-30 DIAGNOSIS — L578 Other skin changes due to chronic exposure to nonionizing radiation: Secondary | ICD-10-CM | POA: Diagnosis not present

## 2020-03-30 DIAGNOSIS — L503 Dermatographic urticaria: Secondary | ICD-10-CM | POA: Diagnosis not present

## 2020-03-30 DIAGNOSIS — D225 Melanocytic nevi of trunk: Secondary | ICD-10-CM | POA: Diagnosis not present

## 2020-03-30 DIAGNOSIS — L821 Other seborrheic keratosis: Secondary | ICD-10-CM | POA: Diagnosis not present

## 2020-03-30 DIAGNOSIS — F411 Generalized anxiety disorder: Secondary | ICD-10-CM | POA: Diagnosis not present

## 2020-03-30 DIAGNOSIS — F331 Major depressive disorder, recurrent, moderate: Secondary | ICD-10-CM | POA: Diagnosis not present
